# Patient Record
Sex: Female | Born: 1990 | Race: Black or African American | Hispanic: No | Marital: Single | Smoking: Never smoker
Health system: Southern US, Community
[De-identification: ages and names within clinical notes are randomized; demographics above are authoritative.]

---
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
## 2019-03-24 ENCOUNTER — Other Ambulatory Visit: Payer: Self-pay

## 2019-03-24 ENCOUNTER — Encounter (HOSPITAL_COMMUNITY): Payer: Self-pay | Admitting: Emergency Medicine

## 2019-03-24 ENCOUNTER — Ambulatory Visit (HOSPITAL_COMMUNITY)
Admission: EM | Admit: 2019-03-24 | Discharge: 2019-03-24 | Disposition: A | Discharge status: Home / Self Care | Payer: 59 | Attending: Emergency Medicine | Admitting: Emergency Medicine

## 2019-03-24 ENCOUNTER — Ambulatory Visit (INDEPENDENT_AMBULATORY_CARE_PROVIDER_SITE_OTHER): Payer: 59

## 2019-03-24 DIAGNOSIS — S63502A Unspecified sprain of left wrist, initial encounter: Secondary | ICD-10-CM

## 2019-03-24 MED ORDER — ACETAMINOPHEN 325 MG PO TABS
ORAL_TABLET | ORAL | Status: AC
  Filled 2019-03-24: qty 2

## 2019-03-24 MED ORDER — ACETAMINOPHEN 325 MG PO TABS
650.0000 mg | ORAL_TABLET | Freq: Once | ORAL | Status: AC
  Administered 2019-03-24: 650 mg via ORAL
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
## 2019-03-24 NOTE — Discharge Instructions (Signed)
  You may take 500mg acetaminophen every 4-6 hours or in combination with ibuprofen 400-600mg every 6-8 hours as needed for pain and inflammation.  
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
## 2021-02-14 ENCOUNTER — Encounter (HOSPITAL_COMMUNITY): Payer: Self-pay

## 2021-02-14 ENCOUNTER — Emergency Department (HOSPITAL_COMMUNITY)
Admission: EM | Admit: 2021-02-14 | Discharge: 2021-02-15 | Disposition: A | Discharge status: Home / Self Care | Payer: Self-pay | Attending: Emergency Medicine | Admitting: Emergency Medicine

## 2021-02-14 ENCOUNTER — Other Ambulatory Visit: Payer: Self-pay

## 2021-02-14 DIAGNOSIS — W540XXA Bitten by dog, initial encounter: Secondary | ICD-10-CM | POA: Insufficient documentation

## 2021-02-14 DIAGNOSIS — L089 Local infection of the skin and subcutaneous tissue, unspecified: Secondary | ICD-10-CM | POA: Insufficient documentation

## 2021-02-14 DIAGNOSIS — S61251A Open bite of left index finger without damage to nail, initial encounter: Secondary | ICD-10-CM | POA: Insufficient documentation

## 2021-02-14 DIAGNOSIS — Z23 Encounter for immunization: Secondary | ICD-10-CM | POA: Insufficient documentation

## 2021-02-14 NOTE — ED Triage Notes (Signed)
Pt states she was bit on left index finger 5 days ago. Dog is vaccinated. Pt states she noticed pus draining from wound today. Pt does not know when her last tetanus was.

## 2021-02-15 ENCOUNTER — Emergency Department (HOSPITAL_COMMUNITY): Payer: Self-pay

## 2021-02-15 MED ORDER — DOXYCYCLINE HYCLATE 100 MG PO TABS
100.0000 mg | ORAL_TABLET | Freq: Once | ORAL | Status: AC
  Administered 2021-02-15: 100 mg via ORAL
  Filled 2021-02-15: qty 1

## 2021-02-15 MED ORDER — TETANUS-DIPHTH-ACELL PERTUSSIS 5-2.5-18.5 LF-MCG/0.5 IM SUSY
0.5000 mL | PREFILLED_SYRINGE | Freq: Once | INTRAMUSCULAR | Status: AC
  Administered 2021-02-15: 0.5 mL via INTRAMUSCULAR
  Filled 2021-02-15: qty 0.5

## 2021-02-15 MED ORDER — METRONIDAZOLE 500 MG PO TABS: 500.0000 mg | ORAL_TABLET | Freq: Three times a day (TID) | ORAL | 0 refills | Status: AC

## 2021-02-15 MED ORDER — METRONIDAZOLE 500 MG PO TABS
500.0000 mg | ORAL_TABLET | Freq: Once | ORAL | Status: AC
  Administered 2021-02-15: 500 mg via ORAL
  Filled 2021-02-15: qty 1

## 2021-02-15 MED ORDER — DOXYCYCLINE HYCLATE 100 MG PO CAPS: 100.0000 mg | ORAL_CAPSULE | Freq: Two times a day (BID) | ORAL | 0 refills | Status: AC

## 2021-02-15 NOTE — Discharge Instructions (Addendum)
You were evaluated in the Emergency Department and after careful evaluation, we did not find any emergent condition requiring admission or further testing in the hospital.  Your exam/testing today was overall reassuring.  X-ray was normal.  Please take the Flagyl and doxycycline antibiotics as directed.  Use Tylenol or Motrin for pain.  Recommend recheck of this wound in 2 or 3 days.  Please return to the Emergency Department if you experience any worsening of your condition.  Thank you for allowing [place] to be a part of your care.

## 2021-02-15 NOTE — ED Notes (Signed)
Patient transported to X-ray 

## 2021-02-15 NOTE — ED Provider Notes (Signed)
[facility] Emergency Department Provider Note MRN:  [id number]  Arrival date & time: 02/15/21     Chief Complaint   Animal Bite   History of Present Illness   Deborah Cooper is a 30 y.o. year-old female with no pertinent past medical history presenting to the ED with chief complaint of animal bite.  Friend's dog bit her left pointer finger 5 days ago, her mother has been tending to the wound since then and it has been looking overall fine until today when it looked more red, more painful, one of the puncture wounds draining purulent material.  Denies fever, no other injuries or complaints pain is moderate, much worse with any motion or palpation.  Review of Systems  A complete 10 system review of systems was obtained and all systems are negative except as noted in the HPI and PMH.   Patient's Health History   History reviewed. No pertinent past medical history.  History reviewed. No pertinent surgical history.  History reviewed. No pertinent family history.  Social History   Socioeconomic History  . Marital status: Single    Spouse name: Not on file  . Number of children: Not on file  . Years of education: Not on file  . Highest education level: Not on file  Occupational History  . Not on file  Tobacco Use  . Smoking status: Never Smoker  . Smokeless tobacco: Never Used  Substance and Sexual Activity  . Alcohol use: Never  . Drug use: Never  . Sexual activity: Never  Other Topics Concern  . Not on file  Social History Narrative  . Not on file   Social Determinants of Health   Financial Resource Strain: Not on file  Food Insecurity: Not on file  Transportation Needs: Not on file  Physical Activity: Not on file  Stress: Not on file  Social Connections: Not on file  Intimate Partner Violence: Not on file     Physical Exam   Vitals:   02/15/21 0056 02/15/21 0216  BP:  (!) 126/95  Pulse:  72  Resp:  17  Temp:    SpO2: 99% 100%     CONSTITUTIONAL: Well-appearing, NAD NEURO:  Alert and oriented x 3, no focal deficits EYES:  eyes equal and reactive ENT/NECK:  no LAD, no JVD CARDIO: Regular rate, well-perfused, normal S1 and S2 PULM:  CTAB no wheezing or rhonchi GI/GU:  normal bowel sounds, non-distended, non-tender MSK/SPINE:  No gross deformities, no edema SKIN: Erythema and tenderness to palpation to the left distal pointer finger with multiple puncture wounds present, one of them oozing a small amount of purulent material PSYCH:  Appropriate speech and behavior  *Additional and/or pertinent findings included in MDM below  Diagnostic and Interventional Summary    EKG Interpretation  Date/Time:    Ventricular Rate:    PR Interval:    QRS Duration:   QT Interval:    QTC Calculation:   R Axis:     Text Interpretation:        Labs Reviewed - No data to display  DG Hand 2 View Left  Final Result      Medications  doxycycline (VIBRA-TABS) tablet 100 mg (100 mg Oral Given 02/15/21 0145)  metroNIDAZOLE (FLAGYL) tablet 500 mg (500 mg Oral Given 02/15/21 0145)  Tdap (BOOSTRIX) injection 0.5 mL (0.5 mLs Intramuscular Given 02/15/21 0146)     Procedures  /  Critical Care Procedures  ED Course and Medical Decision Making  I  have reviewed the triage vital signs, the nursing notes, and pertinent available records from the EMR.  Listed above are laboratory and imaging tests that I personally ordered, reviewed, and interpreted and then considered in my medical decision making (see below for details).  Infection due to dog bite, there is no fluctuance or any sign of need for I&D at this time, actively draining.  Dog is known to the patient, dog is vaccinated for rabies.  Will x-ray to exclude foreign body or underlying fracture.  Will update tetanus, started on antibiotics.  Patient is pen allergic, will provide doxycycline and Flagyl.  Patient states there is no chance she is pregnant and declines pregnancy testing  at this time.  Advised recheck of this wound in 2 or 3 days, if not at urgent care or PCP here in the emergency department.       Elmer Sow. Pilar Plate, MD [facility] Emergency Medicine [facility] [email or web address]  Final Clinical Impressions(s) / ED Diagnoses     ICD-10-CM   1. Dog bite, initial encounter  W54.0XXA   2. Finger infection  L08.9     ED Discharge Orders         Ordered    doxycycline (VIBRAMYCIN) 100 MG capsule  2 times daily        02/15/21 0218    metroNIDAZOLE (FLAGYL) 500 MG tablet  3 times daily        02/15/21 0218           Discharge Instructions Discussed with and Provided to Patient:     Discharge Instructions     You were evaluated in the Emergency Department and after careful evaluation, we did not find any emergent condition requiring admission or further testing in the hospital.  Your exam/testing today was overall reassuring.  X-ray was normal.  Please take the Flagyl and doxycycline antibiotics as directed.  Use Tylenol or Motrin for pain.  Recommend recheck of this wound in 2 or 3 days.  Please return to the Emergency Department if you experience any worsening of your condition.  Thank you for allowing [place] to be a part of your care.        Sabas Sous, MD 02/15/21 [phone]

## 2021-02-15 NOTE — ED Notes (Signed)
Pt reports does to want to call animal control, pts dog belongs to a cousin, NADN.

## 2022-10-03 IMAGING — CR DG HAND 2V*L*
2 series · 2 of 2 positions shown · non-contrast
Comparison: Left wrist radiograph 04/23/2019

CLINICAL DATA: Concern for foreign body at right index finger, dog
bite

EXAM:
LEFT HAND - 2 VIEW

[hand pa]
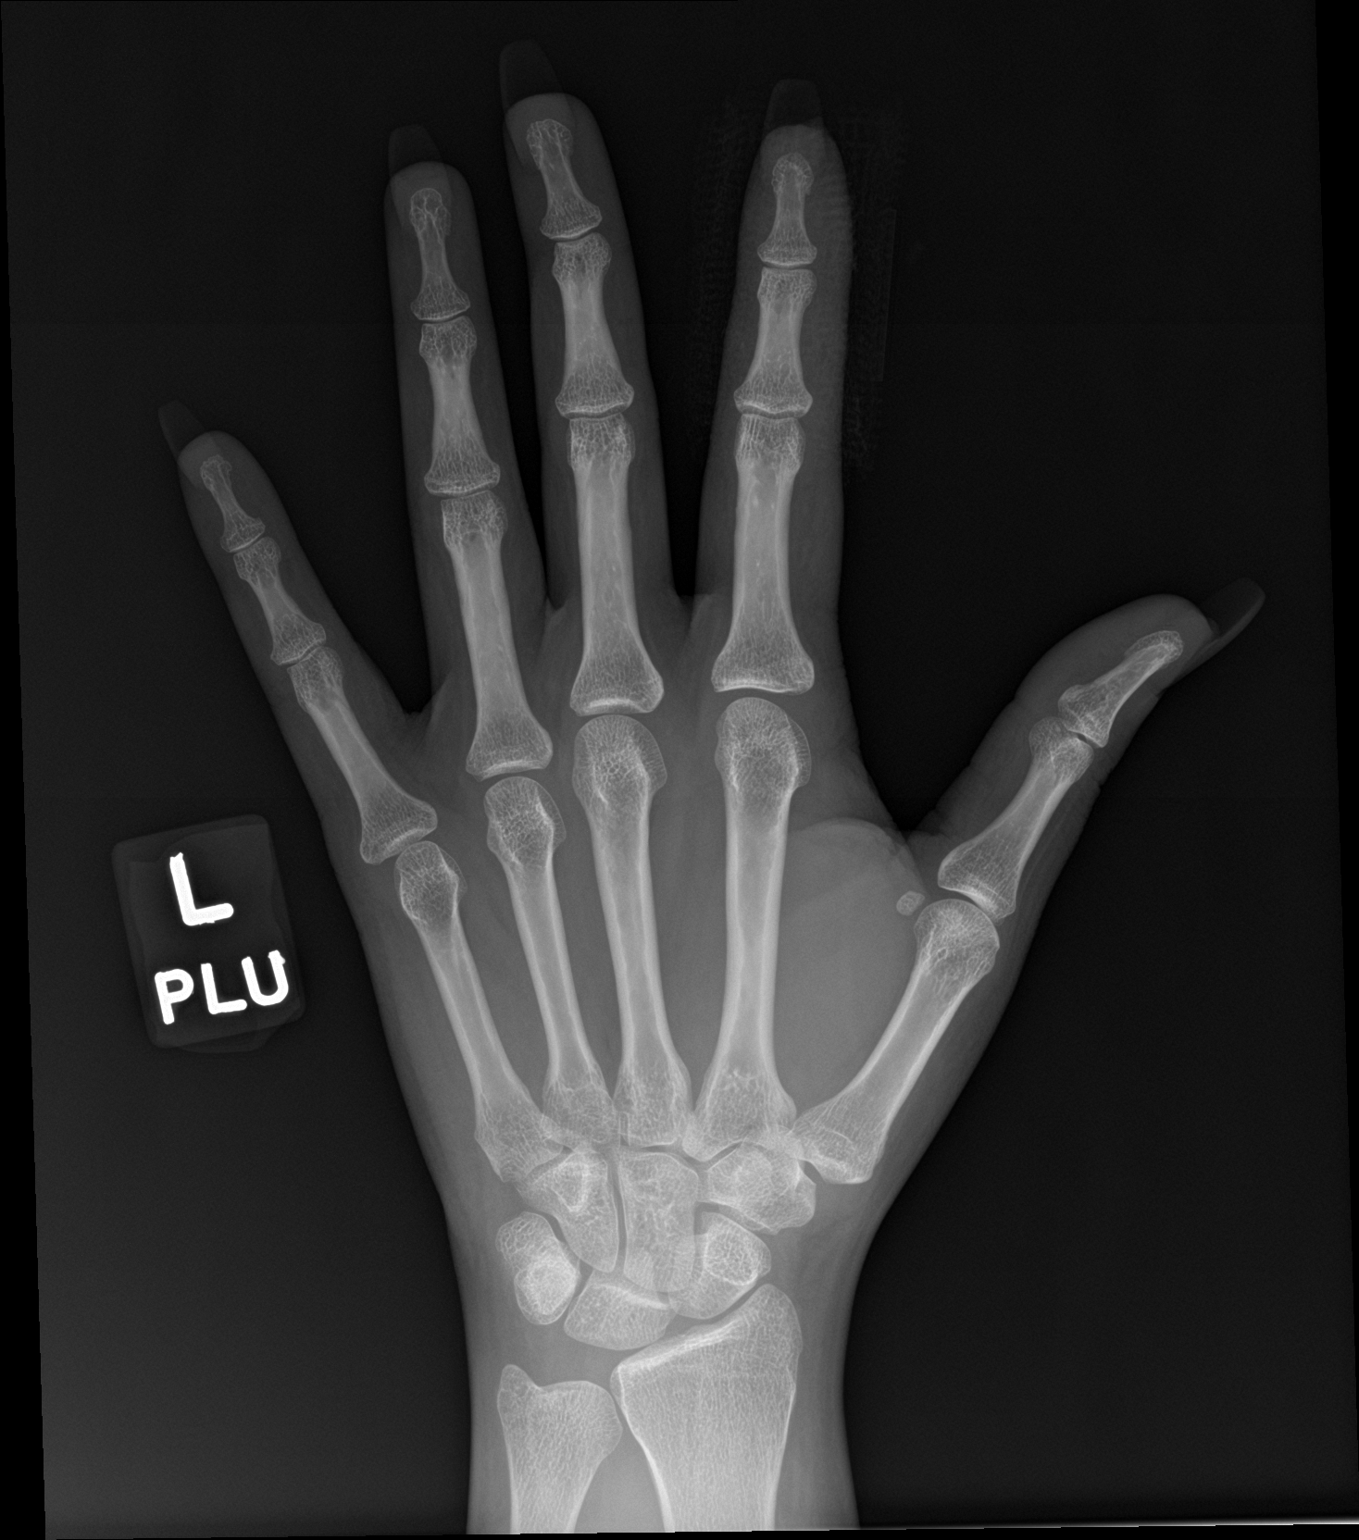

[hand lat]
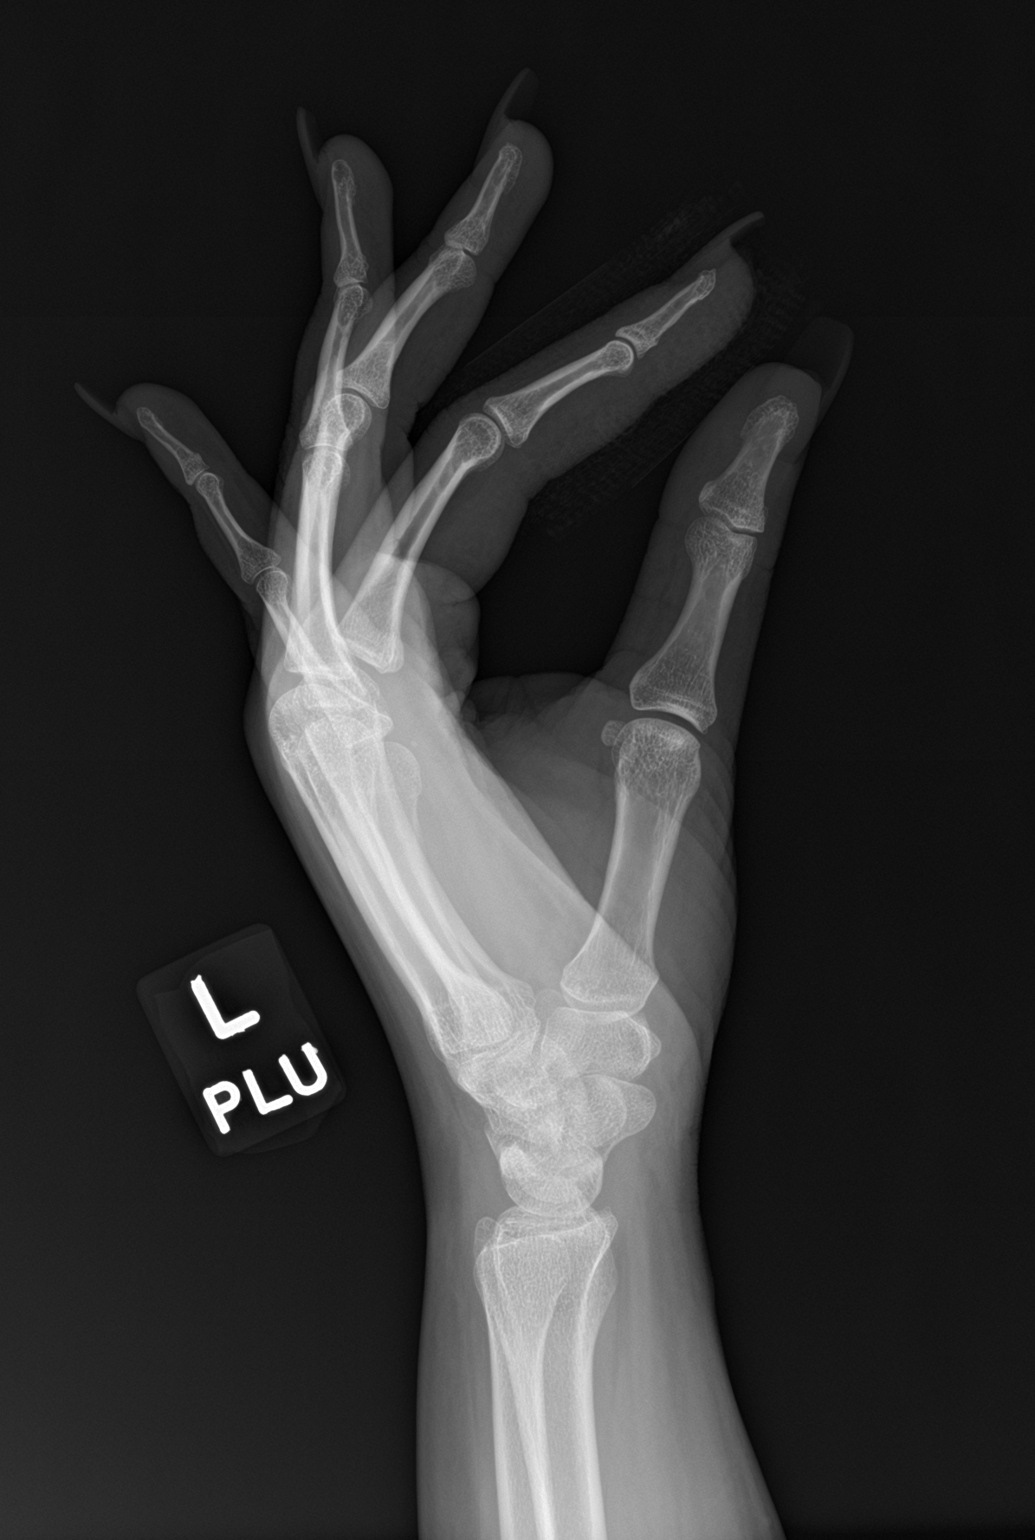

[2 of 2 positions shown; findings below may reference images not displayed]

FINDINGS: No visible radiopaque foreign body. Bandaging material noted about
the second digit. No acute bony abnormality. Specifically, no
fracture, subluxation, or dislocation.
IMPRESSION: Bandaging material about the second digit with some minimal soft
tissue irregularity, correlate for laceration.

No radiopaque foreign body.

No acute osseous abnormality.

## 2023-11-23 ENCOUNTER — Emergency Department (HOSPITAL_COMMUNITY): Payer: 59

## 2023-11-23 ENCOUNTER — Encounter (HOSPITAL_COMMUNITY): Payer: Self-pay

## 2023-11-23 ENCOUNTER — Other Ambulatory Visit: Payer: Self-pay

## 2023-11-23 ENCOUNTER — Observation Stay (HOSPITAL_COMMUNITY): Payer: 59

## 2023-11-23 ENCOUNTER — Inpatient Hospital Stay (HOSPITAL_COMMUNITY)
Admission: EM | Admit: 2023-11-23 | Discharge: 2023-11-27 | DRG: 098 | Disposition: A | Discharge status: Home / Self Care | Payer: 59 | Attending: Internal Medicine | Admitting: Internal Medicine

## 2023-11-23 DIAGNOSIS — Z823 Family history of stroke: Secondary | ICD-10-CM

## 2023-11-23 DIAGNOSIS — R5383 Other fatigue: Secondary | ICD-10-CM | POA: Diagnosis present

## 2023-11-23 DIAGNOSIS — G9340 Encephalopathy, unspecified: Secondary | ICD-10-CM | POA: Diagnosis present

## 2023-11-23 DIAGNOSIS — R0902 Hypoxemia: Secondary | ICD-10-CM | POA: Diagnosis not present

## 2023-11-23 DIAGNOSIS — R569 Unspecified convulsions: Secondary | ICD-10-CM | POA: Diagnosis not present

## 2023-11-23 DIAGNOSIS — M47812 Spondylosis without myelopathy or radiculopathy, cervical region: Secondary | ICD-10-CM | POA: Diagnosis present

## 2023-11-23 DIAGNOSIS — G9341 Metabolic encephalopathy: Secondary | ICD-10-CM | POA: Diagnosis present

## 2023-11-23 DIAGNOSIS — R000 Tachycardia, unspecified: Secondary | ICD-10-CM | POA: Diagnosis not present

## 2023-11-23 DIAGNOSIS — M50222 Other cervical disc displacement at C5-C6 level: Secondary | ICD-10-CM | POA: Diagnosis present

## 2023-11-23 DIAGNOSIS — Z880 Allergy status to penicillin: Secondary | ICD-10-CM

## 2023-11-23 DIAGNOSIS — R059 Cough, unspecified: Secondary | ICD-10-CM | POA: Diagnosis present

## 2023-11-23 DIAGNOSIS — R4182 Altered mental status, unspecified: Principal | ICD-10-CM

## 2023-11-23 DIAGNOSIS — M545 Low back pain, unspecified: Secondary | ICD-10-CM | POA: Diagnosis present

## 2023-11-23 DIAGNOSIS — G030 Nonpyogenic meningitis: Secondary | ICD-10-CM | POA: Diagnosis not present

## 2023-11-23 DIAGNOSIS — G9349 Other encephalopathy: Secondary | ICD-10-CM | POA: Diagnosis present

## 2023-11-23 DIAGNOSIS — E66813 Obesity, class 3: Secondary | ICD-10-CM | POA: Diagnosis present

## 2023-11-23 DIAGNOSIS — Z1152 Encounter for screening for COVID-19: Secondary | ICD-10-CM

## 2023-11-23 DIAGNOSIS — E876 Hypokalemia: Secondary | ICD-10-CM | POA: Diagnosis not present

## 2023-11-23 LAB — I-STAT VENOUS BLOOD GAS, ED
Acid-Base Excess: 2 mmol/L (ref 0.0–2.0)
Bicarbonate: 25.1 mmol/L (ref 20.0–28.0)
Calcium, Ion: 1.13 mmol/L — ABNORMAL LOW (ref 1.15–1.40)
HCT: 39 % (ref 36.0–46.0)
Hemoglobin: 13.3 g/dL (ref 12.0–15.0)
O2 Saturation: 88 %
Potassium: 3.7 mmol/L (ref 3.5–5.1)
Sodium: 141 mmol/L (ref 135–145)
TCO2: 26 mmol/L (ref 22–32)
pCO2, Ven: 34.4 mm[Hg] — ABNORMAL LOW (ref 44–60)
pH, Ven: 7.471 — ABNORMAL HIGH (ref 7.25–7.43)
pO2, Ven: 51 mm[Hg] — ABNORMAL HIGH (ref 32–45)

## 2023-11-23 LAB — RESP PANEL BY RT-PCR (RSV, FLU A&B, COVID)  RVPGX2
Influenza A by PCR: NEGATIVE
Influenza B by PCR: NEGATIVE
Resp Syncytial Virus by PCR: NEGATIVE
SARS Coronavirus 2 by RT PCR: NEGATIVE

## 2023-11-23 LAB — COMPREHENSIVE METABOLIC PANEL
ALT: 52 U/L — ABNORMAL HIGH (ref 0–44)
AST: 37 U/L (ref 15–41)
Albumin: 3.6 g/dL (ref 3.5–5.0)
Alkaline Phosphatase: 57 U/L (ref 38–126)
Anion gap: 10 (ref 5–15)
BUN: 8 mg/dL (ref 6–20)
CO2: 23 mmol/L (ref 22–32)
Calcium: 8.9 mg/dL (ref 8.9–10.3)
Chloride: 105 mmol/L (ref 98–111)
Creatinine, Ser: 0.7 mg/dL (ref 0.44–1.00)
GFR, Estimated: >60 mL/min (ref >60)
Glucose, Bld: 117 mg/dL — ABNORMAL HIGH (ref 70–99)
Potassium: 3.6 mmol/L (ref 3.5–5.1)
Sodium: 138 mmol/L (ref 135–145)
Total Bilirubin: 0.4 mg/dL (ref 0.0–1.2)
Total Protein: 7.3 g/dL (ref 6.5–8.1)

## 2023-11-23 LAB — RAPID URINE DRUG SCREEN, HOSP PERFORMED
Amphetamines: NOT DETECTED
Barbiturates: NOT DETECTED
Benzodiazepines: NOT DETECTED
Cocaine: NOT DETECTED
Opiates: NOT DETECTED
Tetrahydrocannabinol: NOT DETECTED

## 2023-11-23 LAB — C-REACTIVE PROTEIN: CRP: 0.6 mg/dL (ref <1.0)

## 2023-11-23 LAB — CBC
HCT: 40.6 % (ref 36.0–46.0)
Hemoglobin: 12.5 g/dL (ref 12.0–15.0)
MCH: 25.5 pg — ABNORMAL LOW (ref 26.0–34.0)
MCHC: 30.8 g/dL (ref 30.0–36.0)
MCV: 82.9 fL (ref 80.0–100.0)
Platelets: 277 10*3/uL (ref 150–400)
RBC: 4.9 MIL/uL (ref 3.87–5.11)
RDW: 14.5 % (ref 11.5–15.5)
WBC: 7.3 10*3/uL (ref 4.0–10.5)
nRBC: 0 % (ref 0.0–0.2)

## 2023-11-23 LAB — ACETAMINOPHEN LEVEL: Acetaminophen (Tylenol), Serum: <10 ug/mL — ABNORMAL LOW (ref 10–30)

## 2023-11-23 LAB — PREGNANCY, URINE: Preg Test, Ur: NEGATIVE

## 2023-11-23 LAB — TSH: TSH: 1.767 u[IU]/mL (ref 0.350–4.500)

## 2023-11-23 LAB — SALICYLATE LEVEL: Salicylate Lvl: <7 mg/dL — ABNORMAL LOW (ref 7.0–30.0)

## 2023-11-23 LAB — AMMONIA: Ammonia: 27 umol/L (ref 9–35)

## 2023-11-23 LAB — ETHANOL: Alcohol, Ethyl (B): <10 mg/dL (ref <10)

## 2023-11-23 LAB — VITAMIN B12: Vitamin B-12: 581 pg/mL (ref 180–914)

## 2023-11-23 MED ORDER — LORAZEPAM 2 MG/ML IJ SOLN
2.0000 mg | Freq: Four times a day (QID) | INTRAMUSCULAR | Status: DC | PRN
  Administered 2023-11-24: 2 mg via INTRAVENOUS
  Filled 2023-11-23 (×2): qty 1

## 2023-11-23 MED ORDER — LORAZEPAM 2 MG/ML IJ SOLN
1.0000 mg | Freq: Once | INTRAMUSCULAR | Status: AC | PRN
  Administered 2023-11-23: 1 mg via INTRAVENOUS
  Filled 2023-11-23: qty 1

## 2023-11-23 MED ORDER — ACETAMINOPHEN 650 MG RE SUPP: 650.0000 mg | Freq: Four times a day (QID) | RECTAL | Status: DC | PRN

## 2023-11-23 MED ORDER — POLYETHYLENE GLYCOL 3350 17 G PO PACK: 17.0000 g | PACK | Freq: Every day | ORAL | Status: DC | PRN

## 2023-11-23 MED ORDER — ACETAMINOPHEN 325 MG PO TABS: 650.0000 mg | ORAL_TABLET | Freq: Four times a day (QID) | ORAL | Status: DC | PRN

## 2023-11-23 MED ORDER — SODIUM CHLORIDE 0.9% FLUSH
3.0000 mL | Freq: Two times a day (BID) | INTRAVENOUS | Status: DC
  Administered 2023-11-23 – 2023-11-26 (×5): 3 mL via INTRAVENOUS

## 2023-11-23 MED ORDER — SODIUM CHLORIDE 0.9 % IV BOLUS
1000.0000 mL | Freq: Once | INTRAVENOUS | Status: AC
  Administered 2023-11-23: 1000 mL via INTRAVENOUS

## 2023-11-23 MED ORDER — ONDANSETRON HCL 4 MG/2ML IJ SOLN
4.0000 mg | Freq: Once | INTRAMUSCULAR | Status: AC
  Administered 2023-11-23: 4 mg via INTRAVENOUS
  Filled 2023-11-23: qty 2

## 2023-11-23 NOTE — ED Notes (Signed)
 Patient transported to CT

## 2023-11-23 NOTE — ED Notes (Signed)
Unable to obtain Urine at this time.
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
## 2023-11-23 NOTE — Consult Note (Addendum)
 NEUROLOGY CONSULT NOTE   Date of service: November 23, 2023 Patient Name: Deborah Cooper MRN:  [id number] DOB:  May 20, 1991 Chief Complaint: AMS Requesting Provider: Neysa Caron PARAS, DO  History of Present Illness  Deborah Cooper is a 33 y.o. female with no past medical history who presents with altered mental status.  Patient is unable to provide any significant history and most of this information was provided by mother and aunt at bedside.  They report that the patient has been sick for over a week and has been experiencing symptoms including cough, fatigue, intermittent hot flashes with diaphoresis and chills, as well as bodyaches.  Mother does report the patient reported that her lower spine was hurting and that it was improved when she was sitting in a recliner.  Patient reported that her arms are weak and that her legs felt heavy.  Additionally last night mother reported the patient had poor appetite and went to bed early.  This morning patient was found in her bed with her morning alarm going off.  Patient was arousable and opened her eyes but mother reports that her daughter's eyes were glossy.  She reports that the patient was speaking unintelligibly.  EMS was called.  While with EMS she was given oxygen and had marked improvement in her mental status to where she could speak normally again but then was noted to have disorientation.  Patient has not had any significant mental status changes since that time.    Mother denies the patient has waxed or waned with mental status.  Mother denies any seizure-like activity currently or in the past.    ROS  Provided by patient who is altered but able to respond.  Review of Systems  Constitutional:  Positive for chills and diaphoresis. Negative for fever.  HENT:  Negative for congestion, ear pain and hearing loss.   Respiratory:  Positive for cough. Negative for shortness of breath.   Cardiovascular:  Negative for chest pain and palpitations.   Gastrointestinal:  Negative for constipation, diarrhea, nausea and vomiting.  Genitourinary:  Negative for dysuria, hematuria and urgency.  Musculoskeletal:  Positive for myalgias.  Skin:  Negative for rash.  Neurological:  Positive for weakness. Negative for dizziness, tingling, tremors, speech change, focal weakness, loss of consciousness and headaches.  Psychiatric/Behavioral:  Negative for hallucinations and substance abuse. The patient does not have insomnia.      Past History  Medical diagnoses: None Medications: None Surgeries: None Hospitalizations: None Allergies: Penicillin LMP: Unable to identify due to AM  Family History: Stroke in second relative.  No family history of epilepsy.  No other pertinent family history.  Social History Reports that she lives at home with her family.  Reports that she is currently employed.  Reports that she does not use any substances including alcohol, nicotine, cannabis.  Allergies  Allergen Reactions   Penicillins     Medications  No current facility-administered medications for this encounter. No current outpatient medications on file.  Vitals   Vitals:   11/23/23 0855 11/23/23 1049 11/23/23 1342 11/23/23 1441  BP: (!) 125/91 (!) 129/94  134/86  Pulse: (!) 116 98  96  Resp: 18 18  17   Temp: 97.8 F (36.6 C)  98.9 F (37.2 C) 98.1 F (36.7 C)  TempSrc: Oral  Oral   SpO2: 98% 100%  90%    There is no height or weight on file to calculate BMI.  Physical Exam   Constitutional: Appears well-developed and well-nourished.  Laying in bed and not alert but is easily arousable to verbal stimuli. Psych: Affect appropriate to situation. No catatonic features.  Eyes: No scleral injection.  HENT: Tongue has bite marks. No OP obstruction.  No obvious signs of current upper respiratory infection including runny nose, congestion. Head: Normocephalic.  Neck: No meningismus with neck flexion. No neck stiffness with passive ROM.   Cardiovascular: Normal rate and regular rhythm.  No murmurs rubs or gallops. Respiratory: Effort normal, non-labored breathing.  Lungs clear to auscultation bilaterally. GI: Obese.  Soft.  No distension. There is no tenderness.  Bowel sounds present. Skin: WDI.  No obvious rashes.  Neurologic Examination   Mental status exam: Patient is disoriented; she is able to provide her first name but not her middle or her last name, knows she is in [place]  but cannot identify the hospital. Knows last holiday was Christmas but does not know the month, year or season, and is unable to appreciate the current situation.  She has poor attention as evident by conversation and exam with patient.  Memory was difficult to assess due to impaired attention but short-term memory does not appear intact. Executive function is poor and patient is not able to describe abstract thoughts such as the meaning of one of her tattoos.  Speech: Speech fluent without dysarthria. patient able to identify common objects.  Cranial Nerves: II:  Visual fields grossly normal, pupils equal, round, reactive to light and accommodation III,IV, VI: ptosis not present, extra-ocular motions intact bilaterally V,VII: smile symmetric, facial light touch sensation normal bilaterally VIII: hearing normal bilaterally IX,X: uvula rises symmetrically XI: bilateral shoulder shrug XII: midline tongue extension without atrophy or fasciculations Motor: Right : Upper extremity   5/5    Left:     Upper extremity   5/5  Lower extremity   5/5     Lower extremity   5/5 Tone and bulk:normal tone throughout; no atrophy noted Sensory: Light touch intact throughout, bilaterally Deep Tendon Reflexes:  Brachioradialis: 2+ bilaterally  Patellar: left 1+, right 2+ Achilles 2+ bilaterally.  Cerebellar: Normal finger-to-nose bilaterally  Gait: Deferred formal assessment but patient did get out of EEG facility bed and back into her hospital  bed without issues  Labs/Imaging/Neurodiagnostic studies   CBC:  Recent Labs  Lab Dec 18, 2023 1006 12/18/23 1047  WBC 7.3  --   HGB 12.5 13.3  HCT 40.6 39.0  MCV 82.9  --   PLT 277  --    Basic Metabolic Panel:  Lab Results  Component Value Date   NA 141 12-18-23   K 3.7 18-Dec-2023   CO2 23 12/18/2023   GLUCOSE 117 (H) 12-18-2023   BUN 8 December 18, 2023   CREATININE 0.70 12/18/2023   CALCIUM 8.9 12/18/23   GFRNONAA >60 12/18/2023  Liver function unremarkable.   Lipid Panel: No results found for: LDLCALC HgbA1c: No results found for: HGBA1C Urine Drug Screen:     Component Value Date/Time   LABOPIA NONE DETECTED 12/18/23 1441   COCAINSCRNUR NONE DETECTED 2023-12-18 1441   LABBENZ NONE DETECTED 2023-12-18 1441   AMPHETMU NONE DETECTED 2023-12-18 1441   THCU NONE DETECTED Dec 18, 2023 1441   LABBARB NONE DETECTED 12-18-2023 1441    Alcohol Level     Component Value Date/Time   ETH <10 12-18-2023 1006   INR No results found for: INR APTT No results found for: APTT AED levels: No results found for: PHENYTOIN, ZONISAMIDE, LAMOTRIGINE, LEVETIRACETA  EKG: NSR, no abnormalities, Qtc 435  Pregnancy: negative  Respiratory panel: negative Acetaminophen : negative Salicylate level: negative  CT Head without contrast(Personally reviewed): No acute intracranial process  MRI Brain: Pending   Neurodiagnostics rEEG: Continuous slow, generalized   ASSESSMENT  Sury CONSANDRA LASKE is a 33 y.o. female with no past medical history who presents with disorientation and impaired attention in the setting of recent upper respiratory infection.   - Patient has some disorientation on neurologic exam, but question if this is due to embellishment or due to an underlying neurological etiology. Suspect the latter due to the continuous slowing on EEG. DDx for event at home includes subclinical seizure followed by postictal state, or toxidrome. No asterixis on exam, but does  appear somewhat drowsy. No nuchal rigidity and she does not appear toxic.  - ALT elevated, but AST and T. Bili are normal. Ca and Na are normal. Glucose mildly elevated at 117. BUN and Cr are normal. WBC normal. UDS negative.   - CT head is normal.   - Vitals have been stable and labs have been unremarkable decreasing likelihood for electrolyte disturbances, infectious causes, endocrine disorders, and toxicities. EEG was not consistent with seizure and thus is suggestive of nonepileptic etiology. Patient did have some risk factors for CNS infection including preceding respiratory infection and spinal pain prior to admission but currently afebrile, normal neuro exam, and no meningismus on exam, so will defer LP in favor of continued observation from a risk/benefit standpoint. If she does not improve, and if LTM does not show any seizures, will most likely need to obtain an LP to assess for possible autoimmune encephalopathy.  - Overall impression: Encephalopathy of unknown etiology, suspect possible new-onset subclinical seizure activity versus autoimmune cause (DDx would include Hashimoto's thyroiditis, CNS lupus, neurosarcoidosis and anti-NMDA receptor encephalitis).    RECOMMENDATIONS  - MRI brain with and without contrast - ESR and CRP (ordered) - Lupus panel (ordered) - TSH, B12, RPR and ammonia (ordered) - Serum anti-NMDA antibody level (ordered as a send-out) - Supportive care including neuro checks - Will obtain LTM EEG to capture a possible spell.   ______________________________________________________________________    Justino Cornish, MD PGY-1 Psychiatry Resident 11/23/2023, 4:23 PM  I have seen and examined the patient. I have formulated the assessment and recommendations. 33 year old female presenting with encephalopathy of unknown etiology. Exam reveals a disoriented and mildly drowsy patient. EEG with diffuse slowing.  Electronically signed: Dr. Nakeyia Menden

## 2023-11-23 NOTE — ED Notes (Addendum)
 Patient transported to MRI

## 2023-11-23 NOTE — ED Notes (Signed)
 Transported to EEG

## 2023-11-23 NOTE — ED Notes (Signed)
 Patient transported to X-ray

## 2023-11-23 NOTE — ED Triage Notes (Signed)
 Pt BIB [facility] from home d/t family reporting AMS this morning when she was woke by her alarm & was speaking incomprehensible words not making sense. EMS reports she has no medical Hx & does not take meds. Endorses she was only alert to self on scene & did she that she bit her tongue, no Hx of seizures. Family reports she had been sick the last few days & her arm hurt. Does have 20g Lt hand PIV, 95% RA & 100% on 2L n/c, CBG 141, 120 bpm, 140/60.

## 2023-11-23 NOTE — H&P (Signed)
 History and Physical   Deborah Cooper FMW:[id number] DOB: 1991/07/05 DOA: 11/23/2023  PCP: Patient, No Pcp Per   Patient coming from: Home  Chief Complaint: Acute encephalopathy  HPI: Deborah Cooper is a 33 y.o. female with no known significant past medical history presenting with encephalopathy.  Family reports finding patient altered this morning.  She was noted to be feeling unwell yesterday with some cough, body aches, fatigue and decreased appetite.  Reportedly her legs seemed heavy and had some trouble holding up a mirror yesterday.  Family went to check on her this morning because her alarms Going off.  She looked at them with a blanks there and had incomprehensible speech.  Some improvement on the way to the ED but is still not at baseline.  No history of seizures no nausea no vomiting no diarrhea no fevers.  Family also reports some complaint of low back pain in addition to the leg heaviness yesterday.  No reports of fevers, chills, chest pain, shortness of breath, abdominal pain, constipation, diarrhea, nausea, vomiting.  ED Course: Vital signs in the ED notable for heart rate in the 90s to 110s, blood pressure in the 120s to 130s systolic.  Lab workup included CMP with glucose 117, ALT 52.  CBC within normal limits.  Rester panel for flu COVID RSV negative.  Tylenol , aspirin, ethanol negative.  UDS normal.  VBG with pH 7.47 and pCO2 34.  Chest x-ray showed no acute Sharol.  CT head showed no acute abnormality.  MR brain pending.  Patient received 1 L of IV fluids in the ED as well as a dose of Zofran .  Neurology consulted recommending EEG and MRI and will follow.  Review of Systems: As per HPI otherwise all other systems reviewed and are negative.  History reviewed. No pertinent past medical history.  History reviewed. No pertinent surgical history.  Social History  reports that she has never smoked. She has never used smokeless tobacco. She reports that she does not drink  alcohol and does not use drugs.  Allergies  Allergen Reactions   Penicillins     History reviewed. No pertinent family history.  Prior to Admission medications   Not on File   Physical Exam: Vitals:   11/23/23 0855 11/23/23 1049 11/23/23 1342 11/23/23 1441  BP: (!) 125/91 (!) 129/94  134/86  Pulse: (!) 116 98  96  Resp: 18 18  17   Temp: 97.8 F (36.6 C)  98.9 F (37.2 C) 98.1 F (36.7 C)  TempSrc: Oral  Oral   SpO2: 98% 100%  90%    Physical Exam Constitutional:      General: She is not in acute distress.    Appearance: Normal appearance.  HENT:     Head: Normocephalic and atraumatic.     Mouth/Throat:     Mouth: Mucous membranes are moist.     Pharynx: Oropharynx is clear.  Eyes:     Extraocular Movements: Extraocular movements intact.     Pupils: Pupils are equal, round, and reactive to light.  Cardiovascular:     Rate and Rhythm: Normal rate and regular rhythm.     Pulses: Normal pulses.     Heart sounds: Normal heart sounds.  Pulmonary:     Effort: Pulmonary effort is normal. No respiratory distress.     Breath sounds: Normal breath sounds.  Abdominal:     General: Bowel sounds are normal. There is no distension.     Palpations: Abdomen is soft.  Tenderness: There is no abdominal tenderness.  Musculoskeletal:        General: No swelling or deformity.  Skin:    General: Skin is warm and dry.  Neurological:     Comments: Patient is awake, alert, oriented x2 (not to year) Motor: Reduced but adequate effort thorughout, at Least 5/5 bilateral UE, 5/5 bilateral lower extremitiy  Sensory: Sensation is grossly intact bilateral UEs & LEs    Labs on Admission: I have personally reviewed following labs and imaging studies  CBC: Recent Labs  Lab 11/23/23 1006 11/23/23 1047  WBC 7.3  --   HGB 12.5 13.3  HCT 40.6 39.0  MCV 82.9  --   PLT 277  --     Basic Metabolic Panel: Recent Labs  Lab 11/23/23 1006 11/23/23 1047  NA 138 141  K 3.6 3.7  CL  105  --   CO2 23  --   GLUCOSE 117*  --   BUN 8  --   CREATININE 0.70  --   CALCIUM 8.9  --     GFR: CrCl cannot be calculated (Unknown ideal weight.).  Liver Function Tests: Recent Labs  Lab 11/23/23 1006  AST 37  ALT 52*  ALKPHOS 57  BILITOT 0.4  PROT 7.3  ALBUMIN 3.6    Urine analysis: No results found for: COLORURINE, APPEARANCEUR, LABSPEC, PHURINE, GLUCOSEU, HGBUR, BILIRUBINUR, KETONESUR, PROTEINUR, UROBILINOGEN, NITRITE, LEUKOCYTESUR  Radiological Exams on Admission: CT HEAD WO CONTRAST ( ) Result Date: 11/23/2023 CLINICAL DATA:  Altered mental status EXAM: CT HEAD WITHOUT CONTRAST TECHNIQUE: Contiguous axial images were obtained from the base of the skull through the vertex without intravenous contrast. RADIATION DOSE REDUCTION: This exam was performed according to the departmental dose-optimization program which includes automated exposure control, adjustment of the mA and/or kV according to patient size and/or use of iterative reconstruction technique. COMPARISON:  None Available. FINDINGS: Brain: No evidence of acute infarction, hemorrhage, mass, mass effect, or midline shift. No hydrocephalus or extra-axial fluid collection. Gray-white differentiation is preserved. Basal cisterns are patent. Vascular: No hyperdense vessel. Skull: Negative for fracture or focal lesion. Sinuses/Orbits: Mucous retention cysts in the maxillary sinuses. Mucosal thickening in the frontal sinuses and ethmoid air cells. No acute finding in the orbits. Other: The mastoid air cells are well aerated. IMPRESSION: No acute intracranial process. Electronically Signed   By: Donald Campion M.D.   On: 11/23/2023 12:18   DG Chest 2 View Result Date: 11/23/2023 CLINICAL DATA:  Altered mental status. EXAM: CHEST - 2 VIEW COMPARISON:  None Available. FINDINGS: Cardiac silhouette and mediastinal contours are within normal limits. The lungs are clear. No pleural effusion or pneumothorax.  Mild dextrocurvature centered at L1-2. IMPRESSION: No active cardiopulmonary disease. Electronically Signed   By: Tanda Lyons M.D.   On: 11/23/2023 11:22    EKG: Independently reviewed.  Sinus rhythm at 91 bpm.  Assessment/Plan Principal Problem:   Acute encephalopathy   Acute encephalopathy > As per HPI noted to be altered this morning when family noted patient was not responding to her alarms.  Initially blinks there and incomprehensible speech.  Some improvement in the ED but still confused and off baseline. > Unclear etiology with no electrolyte abnormality in the ED and no evidence of infection.  Normal Tylenol  level, aspirin level.  Negative EtOH screen, normal UDS.  Chest x-ray with no acute abnormality.  CT head with no acute abnormality.  Respiratory panel negative.  Patient did have some back pain yesterday with leg heaviness however  also had some upper extremity and now some confusion component so do not suspect a lumbar etiology. > Neurology consulted and recommended EEG and MRI.  Could represent prolonged ictal state if new onset seizures.  If workup negative could be psychologic component to her presentation. - Appreciate neurology recommendations and assistance - Monitor on telemetry overnight - Follow-up MRI brain - Follow-up EEG - Supportive care   DVT prophylaxis: SCDs for now Code Status:   Full Family Communication:  Updated at bedside. Disposition Plan:   Patient is from:  Home  Anticipated DC to:  Home  Anticipated DC date:  1 to 2 days  Anticipated DC barriers: None  Consults called:  Neurology Admission status:  Observation, telemetry  Severity of Illness: The appropriate patient status for this patient is OBSERVATION. Observation status is judged to be reasonable and necessary in order to provide the required intensity of service to ensure the patient's safety. The patient's presenting symptoms, physical exam findings, and initial radiographic and laboratory  data in the context of their medical condition is felt to place them at decreased risk for further clinical deterioration. Furthermore, it is anticipated that the patient will be medically stable for discharge from the hospital within 2 midnights of admission.    Marsa KATHEE Scurry MD [facility]  How to contact the [facility] Attending or Consulting provider 7A - 7P or covering provider during after hours 7P -7A, for this patient?   Check the care team in [facility] and look for a) attending/consulting [facility] provider listed and b) the [facility] team listed Log into [email or web address] and use [place]'s universal password to access. If you do not have the password, please contact the hospital operator. Locate the [facility] provider you are looking for under [facility] and page to a number that you can be directly reached. If you still have difficulty reaching the provider, please page the [facility] (Director on Call) for the Hospitalists listed on amion for assistance.  11/23/2023, 5:07 PM

## 2023-11-23 NOTE — ED Provider Notes (Signed)
  Physical Exam  BP 134/86   Pulse 96   Temp 98.1 F (36.7 C)   Resp 17   SpO2 90%   Physical Exam  Procedures  Procedures  ED Course / MDM   Clinical Course as of 11/23/23 1611  Tue Nov 23, 2023  0938 CXR, covid/flu, CT head, labs, etoh, drug screen, tylenol danae [WF]    Clinical Course User Index [WF] Neldon Hamp RAMAN, [name]   Medical Decision Making Amount and/or Complexity of Data Reviewed Labs: ordered. Radiology: ordered.  Risk Prescription drug management. Decision regarding hospitalization.   Woke with AMS Has abnormal neuro exam, now 9 hours later ? Sz with prolonged post-ictal ?neuro event Per neurology, patient should be admitted for EEG, MRI  Discussed with Dr. Seena, [facility]) who accepts for admission.        Odell Balls, PA-C 11/23/23 1712    Neysa Caron PARAS, DO 11/27/23 1104

## 2023-11-23 NOTE — ED Provider Notes (Signed)
 [place] EMERGENCY DEPARTMENT AT [facility] Provider Note   CSN: [id number] Arrival date & time: 11/23/23  9156     History  Chief Complaint  Patient presents with   AMS    [name] is a 33 y.o. female.  HPI  Patient was brought in by EMS due to AMS noted by family this morning. Patients family reports that patient started having cold like symptoms the day prior, mostly cough. Patient reported to have been experiencing body aches and fatigue. Lack of appetite as well; did not eat dinner. Patient reported to have been feeling heaviness in their legs. Pt was also trying to hold up a medium sized mirror the day prior and was seemingly struggling to do so. Patient family also reports that patient was complaining of lower back pain.  Patient alarms reportedly kept going off this morning which prompted someone to check on the patient; patient reportedly found laying in bed with a blank stare. Patient was speaking incomprehensible words and not making sense. Patients family reports that the patient has had some improvement between then and arrival at the hospital; pt is still seemingly disoriented but will occasionally speak a sentence. Family denies patient having a fever or N/V/D. No history of seizures. No recent trauma.                                                                                                                                                                                                       Home Medications Prior to Admission medications   Not on File      Allergies    Penicillins    Review of Systems   Review of Systems  Physical Exam Updated Vital Signs BP 134/86   Pulse 96   Temp 98.1 F (36.7 C)   Resp 17   SpO2 90%  Physical Exam Vitals and nursing note reviewed.  Constitutional:      General: She is not in acute distress.    Appearance: Normal appearance. She is obese.  HENT:     Head: Normocephalic and atraumatic.      Nose: Nose normal.     Mouth/Throat:     Comments: Small cut on tip of tongue no bleeding Eyes:     General: No scleral icterus. Cardiovascular:     Rate and Rhythm: Normal rate and regular rhythm.     Pulses: Normal pulses.     Heart sounds: Normal heart sounds.  Pulmonary:     Effort: Pulmonary effort is normal. No respiratory distress.  Breath sounds: No wheezing.  Abdominal:     Palpations: Abdomen is soft.     Tenderness: There is no abdominal tenderness.  Musculoskeletal:     Cervical back: Normal range of motion.     Right lower leg: No edema.     Left lower leg: No edema.  Skin:    General: Skin is warm and dry.     Capillary Refill: Capillary refill takes less than 2 seconds.  Neurological:     Mental Status: She is alert. Mental status is at baseline.     Comments: Nonfocal neuroexam  Patient moves all 4 extremities, smiles, is slow to follow commands but does so when the commands are clarified and repeated.  Orientation: Patient is oriented to self, incorrectly identifies year is 2013, incorrectly identifies month as 2013, and correctly identifies location as 2013. Patient does understand that she is confused and nods her head yes when asked if she is aware that she has same 2013 and appropriately.  Small pupils, symmetric, alert and non-drowsy   Psychiatric:        Mood and Affect: Mood normal.        Behavior: Behavior normal.     ED Results / Procedures / Treatments   Labs (all labs ordered are listed, but only abnormal results are displayed) Labs Reviewed  CBC - Abnormal; Notable for the following components:      Result Value   MCH 25.5 (*)    All other components within normal limits  COMPREHENSIVE METABOLIC PANEL - Abnormal; Notable for the following components:   Glucose, Bld 117 (*)    ALT 52 (*)    All other components within normal limits  ACETAMINOPHEN  LEVEL - Abnormal; Notable for the following components:   Acetaminophen  (Tylenol ), Serum  <10 (*)    All other components within normal limits  SALICYLATE LEVEL - Abnormal; Notable for the following components:   Salicylate Lvl <7.0 (*)    All other components within normal limits  I-STAT VENOUS BLOOD GAS, ED - Abnormal; Notable for the following components:   pH, Ven 7.471 (*)    pCO2, Ven 34.4 (*)    pO2, Ven 51 (*)    Calcium, Ion 1.13 (*)    All other components within normal limits  RESP PANEL BY RT-PCR (RSV, FLU A&B, COVID)  RVPGX2  PREGNANCY, URINE  RAPID URINE DRUG SCREEN, HOSP PERFORMED  ETHANOL    EKG EKG Interpretation Date/Time:  Tuesday November 23 2023 10:41:21 EST Ventricular Rate:  91 PR Interval:  156 QRS Duration:  82 QT Interval:  354 QTC Calculation: 435 R Axis:   42  Text Interpretation: Normal sinus rhythm Nonspecific T wave abnormality Abnormal ECG No previous ECGs available Confirmed by Laurice Coy [phone]) on 11/23/2023 3:08:29 PM  Radiology CT HEAD WO CONTRAST ( ) Result Date: 11/23/2023 CLINICAL DATA:  Altered mental status EXAM: CT HEAD WITHOUT CONTRAST TECHNIQUE: Contiguous axial images were obtained from the base of the skull through the vertex without intravenous contrast. RADIATION DOSE REDUCTION: This exam was performed according to the departmental dose-optimization program which includes automated exposure control, adjustment of the mA and/or kV according to patient size and/or use of iterative reconstruction technique. COMPARISON:  None Available. FINDINGS: Brain: No evidence of acute infarction, hemorrhage, mass, mass effect, or midline shift. No hydrocephalus or extra-axial fluid collection. Gray-white differentiation is preserved. Basal cisterns are patent. Vascular: No hyperdense vessel. Skull: Negative for fracture or focal lesion. Sinuses/Orbits: Mucous retention cysts in the  maxillary sinuses. Mucosal thickening in the frontal sinuses and ethmoid air cells. No acute finding in the orbits. Other: The mastoid air cells are well  aerated. IMPRESSION: No acute intracranial process. Electronically Signed   By: Donald Campion M.D.   On: 11/23/2023 12:18   DG Chest 2 View Result Date: 11/23/2023 CLINICAL DATA:  Altered mental status. EXAM: CHEST - 2 VIEW COMPARISON:  None Available. FINDINGS: Cardiac silhouette and mediastinal contours are within normal limits. The lungs are clear. No pleural effusion or pneumothorax. Mild dextrocurvature centered at L1-2. IMPRESSION: No active cardiopulmonary disease. Electronically Signed   By: Tanda Lyons M.D.   On: 11/23/2023 11:22    Procedures Procedures    Medications Ordered in ED Medications  ondansetron  (ZOFRAN ) injection 4 mg (4 mg Intravenous Given 11/23/23 1235)  sodium chloride  0.9 % bolus 1,000 mL (0 mLs Intravenous Stopped 11/23/23 1453)    ED Course/ Medical Decision Making/ A&P Clinical Course as of 11/23/23 1532  Tue Nov 23, 2023  0938 CXR, covid/flu, CT head, labs, etoh, drug screen, tylenol danae [WF]    Clinical Course User Index [WF] Neldon Hamp RAMAN, [name]                                 Medical Decision Making Amount and/or Complexity of Data Reviewed Labs: ordered. Radiology: ordered.  Risk Prescription drug management.   This patient presents to the ED for concern of AMS, this involves a number of treatment options, and is a complaint that carries with it a moderate to high risk of complications and morbidity. A differential diagnosis was considered for the patient's symptoms which is discussed below:   The differential diagnosis for AMS is extensive and includes, but is not limited to: drug overdose - opioids, alcohol, sedatives, antipsychotics, drug withdrawal, others; Metabolic: hypoxia, hypoglycemia, hyperglycemia, hypercalcemia, hypernatremia, hyponatremia, uremia, hepatic encephalopathy, hypothyroidism, hyperthyroidism, vitamin B12 or thiamine deficiency, carbon monoxide poisoning, Wilson's disease, Lactic acidosis, DKA/HHOS; Infectious: meningitis,  encephalitis, bacteremia/sepsis, urinary tract infection, pneumonia, neurosyphilis; Structural: Space-occupying lesion, (brain tumor, subdural hematoma, hydrocephalus,); Vascular: stroke, subarachnoid hemorrhage, coronary ischemia, hypertensive encephalopathy, CNS vasculitis, thrombotic thrombocytopenic purpura, disseminated intravascular coagulation, hyperviscosity; Psychiatric: Schizophrenia, depression; Other: Seizure, hypothermia, heat stroke, dementia    Co morbidities: Discussed in HPI   Brief History:  Patient was brought in by EMS due to AMS noted by family this morning. Patients family reports that patient started having cold like symptoms the day prior, mostly cough. Patient reported to have been experiencing body aches and fatigue. Lack of appetite as well; did not eat dinner. Patient reported to have been feeling heaviness in their legs. Pt was also trying to hold up a medium sized mirror the day prior and was seemingly struggling to do so. Patient family also reports that patient was complaining of lower back pain.  Patient alarms reportedly kept going off this morning which prompted someone to check on the patient; patient reportedly found laying in bed with a blank stare. Patient was speaking incomprehensible words and not making sense. Patients family reports that the patient has had some improvement between then and arrival at the hospital; pt is still seemingly disoriented but will occasionally speak a sentence. Family denies patient having a fever or N/V/D. No history of seizures. No recent trauma.  EMR reviewed including pt PMHx, past surgical history and past visits to ER.   See HPI for more details   Lab Tests:   Labs unremarkable urine drug screen negative, urine pregnancy negative, COVID RSV flu negative ethanol undetectable CMP CBC unremarkable Tylenol  salicylate levels normal.   Imaging Studies:  CT head and  chest x-ray unremarkable    Cardiac Monitoring:    EKG nonischemic, no interval abnormalities   Medicines ordered:  I ordered medication including Zofran , 1000 L normal saline for treatment of nausea, hydration Reevaluation of the patient after these medicines showed that the patient improved I have reviewed the patients home medicines and have made adjustments as needed   Critical Interventions:     Consults/Attending Physician   Dr. Laurice who recommends consultation with neurology.        Reevaluation:  After the interventions noted above I re-evaluated patient and found that they have :improved slow, gradual improvement.    Social Determinants of Health:      Problem List / ED Course:  Patient with altered mental status since this morning.  Has now been 9 hours since she was found to be altered.  Unknown how long she has been behaving strangely went to bed normal last night.  No head trauma.  CT head unremarkable and labs reassuring no evidence of metabolic abnormalities.  Patient does have small cut on her tongue.  Concern for seizure with slow return to normal. Patient care handed off to SU who will discuss w neurology.   Dispostion:    Final Clinical Impression(s) / ED Diagnoses Final diagnoses:  None    Rx / DC Orders ED Discharge Orders     None         Neldon Hamp RAMAN, [name] 11/23/23 1544    Neysa Caron PARAS, DO 11/23/23 1616

## 2023-11-23 NOTE — Progress Notes (Signed)
STAT EEG complete - results pending. ? ?

## 2023-11-24 ENCOUNTER — Observation Stay (HOSPITAL_COMMUNITY): Payer: 59

## 2023-11-24 ENCOUNTER — Inpatient Hospital Stay (HOSPITAL_COMMUNITY): Payer: 59

## 2023-11-24 DIAGNOSIS — R0902 Hypoxemia: Secondary | ICD-10-CM | POA: Diagnosis not present

## 2023-11-24 DIAGNOSIS — R5383 Other fatigue: Secondary | ICD-10-CM | POA: Diagnosis present

## 2023-11-24 DIAGNOSIS — E66813 Obesity, class 3: Secondary | ICD-10-CM | POA: Diagnosis present

## 2023-11-24 DIAGNOSIS — R4182 Altered mental status, unspecified: Secondary | ICD-10-CM | POA: Diagnosis not present

## 2023-11-24 DIAGNOSIS — G9341 Metabolic encephalopathy: Secondary | ICD-10-CM | POA: Diagnosis present

## 2023-11-24 DIAGNOSIS — M47812 Spondylosis without myelopathy or radiculopathy, cervical region: Secondary | ICD-10-CM | POA: Diagnosis present

## 2023-11-24 DIAGNOSIS — Z880 Allergy status to penicillin: Secondary | ICD-10-CM | POA: Diagnosis not present

## 2023-11-24 DIAGNOSIS — Z823 Family history of stroke: Secondary | ICD-10-CM | POA: Diagnosis not present

## 2023-11-24 DIAGNOSIS — R000 Tachycardia, unspecified: Secondary | ICD-10-CM | POA: Diagnosis not present

## 2023-11-24 DIAGNOSIS — R569 Unspecified convulsions: Secondary | ICD-10-CM | POA: Diagnosis not present

## 2023-11-24 DIAGNOSIS — R059 Cough, unspecified: Secondary | ICD-10-CM | POA: Diagnosis present

## 2023-11-24 DIAGNOSIS — E876 Hypokalemia: Secondary | ICD-10-CM | POA: Diagnosis not present

## 2023-11-24 DIAGNOSIS — G9340 Encephalopathy, unspecified: Secondary | ICD-10-CM | POA: Diagnosis present

## 2023-11-24 DIAGNOSIS — M545 Low back pain, unspecified: Secondary | ICD-10-CM | POA: Diagnosis present

## 2023-11-24 DIAGNOSIS — G030 Nonpyogenic meningitis: Secondary | ICD-10-CM | POA: Diagnosis present

## 2023-11-24 DIAGNOSIS — M50222 Other cervical disc displacement at C5-C6 level: Secondary | ICD-10-CM | POA: Diagnosis present

## 2023-11-24 DIAGNOSIS — Z1152 Encounter for screening for COVID-19: Secondary | ICD-10-CM | POA: Diagnosis not present

## 2023-11-24 LAB — COMPREHENSIVE METABOLIC PANEL
ALT: 36 U/L (ref 0–44)
AST: 25 U/L (ref 15–41)
Albumin: 3.5 g/dL (ref 3.5–5.0)
Alkaline Phosphatase: 57 U/L (ref 38–126)
Anion gap: 10 (ref 5–15)
BUN: 9 mg/dL (ref 6–20)
CO2: 23 mmol/L (ref 22–32)
Calcium: 8.7 mg/dL — ABNORMAL LOW (ref 8.9–10.3)
Chloride: 106 mmol/L (ref 98–111)
Creatinine, Ser: 0.79 mg/dL (ref 0.44–1.00)
GFR, Estimated: >60 mL/min (ref >60)
Glucose, Bld: 87 mg/dL (ref 70–99)
Potassium: 3.3 mmol/L — ABNORMAL LOW (ref 3.5–5.1)
Sodium: 139 mmol/L (ref 135–145)
Total Bilirubin: 1.1 mg/dL (ref 0.0–1.2)
Total Protein: 7.3 g/dL (ref 6.5–8.1)

## 2023-11-24 LAB — CBG MONITORING, ED: Glucose-Capillary: 109 mg/dL — ABNORMAL HIGH (ref 70–99)

## 2023-11-24 LAB — SEDIMENTATION RATE: Sed Rate: 34 mm/h — ABNORMAL HIGH (ref 0–22)

## 2023-11-24 LAB — GLUCOSE, CAPILLARY: Glucose-Capillary: 83 mg/dL (ref 70–99)

## 2023-11-24 LAB — HIV ANTIBODY (ROUTINE TESTING W REFLEX): HIV Screen 4th Generation wRfx: NONREACTIVE

## 2023-11-24 LAB — CBC
HCT: 39.5 % (ref 36.0–46.0)
Hemoglobin: 12.4 g/dL (ref 12.0–15.0)
MCH: 25.4 pg — ABNORMAL LOW (ref 26.0–34.0)
MCHC: 31.4 g/dL (ref 30.0–36.0)
MCV: 80.9 fL (ref 80.0–100.0)
Platelets: 274 10*3/uL (ref 150–400)
RBC: 4.88 MIL/uL (ref 3.87–5.11)
RDW: 14.3 % (ref 11.5–15.5)
WBC: 5.5 10*3/uL (ref 4.0–10.5)
nRBC: 0 % (ref 0.0–0.2)

## 2023-11-24 LAB — RPR: RPR Ser Ql: NONREACTIVE

## 2023-11-24 MED ORDER — LIDOCAINE HCL (PF) 1 % IJ SOLN
10.0000 mL | Freq: Once | INTRAMUSCULAR | Status: AC
  Administered 2023-11-26: 10 mL

## 2023-11-24 MED ORDER — SODIUM CHLORIDE 0.9 % IV SOLN: INTRAVENOUS | Status: AC

## 2023-11-24 MED ORDER — LIDOCAINE HCL (PF) 1 % IJ SOLN
5.0000 mL | Freq: Once | INTRAMUSCULAR | Status: AC
  Administered 2023-11-24: 10 mL

## 2023-11-24 MED ORDER — LIDOCAINE 1 % OPTIME INJ - NO CHARGE
5.0000 mL | Freq: Once | INTRAMUSCULAR | Status: AC
  Administered 2023-11-24: 5 mL via INTRADERMAL

## 2023-11-24 MED ORDER — LORAZEPAM 2 MG/ML IJ SOLN
2.0000 mg | Freq: Once | INTRAMUSCULAR | Status: AC | PRN
  Administered 2023-11-24: 2 mg via INTRAVENOUS
  Filled 2023-11-24: qty 1

## 2023-11-24 MED ORDER — LORAZEPAM 2 MG/ML IJ SOLN
1.0000 mg | Freq: Once | INTRAMUSCULAR | Status: AC | PRN
  Administered 2023-11-24: 1 mg via INTRAVENOUS
  Filled 2023-11-24: qty 1

## 2023-11-24 MED ORDER — DEXTROSE 50 % IV SOLN
1.0000 | INTRAVENOUS | Status: DC | PRN
Start: 2023-11-24 — End: 2023-11-27
  Administered 2023-11-25: 50 mL via INTRAVENOUS
  Filled 2023-11-24: qty 50

## 2023-11-24 NOTE — Plan of Care (Signed)
?  Problem: Education: Goal: Knowledge of General Education information will improve Description: Including pain rating scale, medication(s)/side effects and non-pharmacologic comfort measures Outcome: Progressing   Problem: Clinical Measurements: Goal: Respiratory complications will improve Outcome: Progressing   Problem: Activity: Goal: Risk for activity intolerance will decrease Outcome: Progressing   Problem: Elimination: Goal: Will not experience complications related to urinary retention Outcome: Progressing   

## 2023-11-24 NOTE — Progress Notes (Signed)
 Pt back from MRI. Pt more awake. Able to stand up and brush teeth. Mouth still has dried blood from bite trauma.

## 2023-11-24 NOTE — Procedures (Addendum)
 Patient Name: Deborah Cooper  MRN: [id number]  Epilepsy Attending: Arlin MALVA Krebs  Referring Physician/Provider: Merrianne Locus, MD   Duration: 11/23/2023 2321 to 11/24/2023 1303   Patient history: 32yo F with ams. EEG to evaluate for seizure   Level of alertness: Awake, asleep   AEDs during EEG study: None   Technical aspects: This EEG study was done with scalp electrodes positioned according to the 10-20 International system of electrode placement. Electrical activity was reviewed with band pass filter of 1-70Hz , sensitivity of 7 uV/mm, display speed of 58mm/sec with a 60Hz  notched filter applied as appropriate. EEG data were recorded continuously and digitally stored.  Video monitoring was available and reviewed as appropriate.   Description: The posterior dominant rhythm consists of 8 Hz activity of moderate voltage (25-35 uV) seen predominantly in posterior head regions, symmetric and reactive to eye opening and eye closing. Sleep was characterized by vertex waves, sleep spindles (12 to 14 Hz), maximal frontocentral region. EEG showed intermittent generalized predominantly 5 to 7 Hz theta slowing admixed with intermittent 2-3hz  delta slowing. Hyperventilation and photic stimulation were not performed.      ABNORMALITY - Intermittent slow, generalized   IMPRESSION: This study is suggestive of mild diffuse encephalopathy. No seizures or epileptiform discharges were seen throughout the recording.   Shaquanda Graves O Yerlin Gasparyan

## 2023-11-24 NOTE — Progress Notes (Signed)
 NEUROLOGY CONSULT FOLLOW UP NOTE   Date of service: November 24, 2023 Patient Name: Deborah Cooper MRN:  [id number] DOB:  August 08, 1991  Interval Hx/subjective  Continues to be encephalopathic. Frequent fidgeting and shifting in bed overnight.   Vitals   Vitals:   11/24/23 0605 11/24/23 0612 11/24/23 0725 11/24/23 1050  BP: 101/65  114/64 117/71  Pulse: 99  100 (!) 101  Resp: 16  (!) 22 17  Temp:  98.8 F (37.1 C) 97.9 F (36.6 C) 98.7 F (37.1 C)  TempSrc:  Axillary  Oral  SpO2: 93%  94% 98%     There is no height or weight on file to calculate BMI.  Physical Exam   Physical Exam  HEENT:  [place]/AT. No nuchal rigidity. Oral mucosa with decreased hydration.  Lungs: Respirations unlabored Skin: Warm to touch.  Extremities: Warm and well perfused.   Neurologic Examination   Mental status exam: Patient continues to be disoriented. Drowsy to somnolent. Able to answer questions about how she is feeling, but not in detail. She has poor attention as evident by conversation and exam with patient. Memory is poor. Executive function is poor and patient is not able to describe abstract thoughts.  Speech: Speech fluent but slow, with mild dysarthria in the context of depressed level of consciousness.  Cranial Nerves: II:  PERRL. Fixates on visual stimuli.  III,IV, VI: Ptosis not present, extra-ocular motions intact bilaterally V,VII: Smile symmetric, facial light touch sensation normal bilaterally VIII: Hearing intact to voice bilaterally IX,X: No hypophonia or hoarseness XI: Symmetric XII: Midline tongue  Motor: Right :  Upper extremity   5/5                                      Left:     Upper extremity   5/5             Lower extremity   5/5                                                  Lower extremity   5/5 Tone and bulk:normal tone throughout; no atrophy noted Sensory: Light touch intact throughout, bilaterally Deep Tendon Reflexes: Normoactive.  Cerebellar: No appendicular  ataxia or tremor noted.   Gait: Deferred  Medications  Current Facility-Administered Medications:    acetaminophen  (TYLENOL ) tablet 650 mg, 650 mg, Oral, Q6H PRN **OR** acetaminophen  (TYLENOL ) suppository 650 mg, 650 mg, Rectal, Q6H PRN, Melvin, Alexander B, MD   dextrose  50 % solution 50 mL, 1 ampule, Intravenous, PRN, Sundil, Subrina, MD   LORazepam  (ATIVAN ) injection 2 mg, 2 mg, Intravenous, Q6H PRN, Sundil, Subrina, MD, 2 mg at 11/24/23 0133   polyethylene glycol (MIRALAX  / GLYCOLAX ) packet 17 g, 17 g, Oral, Daily PRN, Melvin, Alexander B, MD   sodium chloride  flush (NS) 0.9 % injection 3 mL, 3 mL, Intravenous, Q12H, Melvin, Alexander B, MD, 3 mL at 11/24/23 1056 No current outpatient medications on file.  Labs and Diagnostic Imaging   CBC:  Recent Labs  Lab 11/23/23 1006 11/23/23 1047  WBC 7.3  --   HGB 12.5 13.3  HCT 40.6 39.0  MCV 82.9  --   PLT 277  --     Basic Metabolic Panel:  Lab Results  Component Value Date   NA 141 11/23/2023   K 3.7 11/23/2023   CO2 23 11/23/2023   GLUCOSE 117 (H) 11/23/2023   BUN 8 11/23/2023   CREATININE 0.70 11/23/2023   CALCIUM 8.9 11/23/2023   GFRNONAA >60 11/23/2023   Lipid Panel: No results found for: LDLCALC HgbA1c: No results found for: HGBA1C Urine Drug Screen:     Component Value Date/Time   LABOPIA NONE DETECTED 11/23/2023 1441   COCAINSCRNUR NONE DETECTED 11/23/2023 1441   LABBENZ NONE DETECTED 11/23/2023 1441   AMPHETMU NONE DETECTED 11/23/2023 1441   THCU NONE DETECTED 11/23/2023 1441   LABBARB NONE DETECTED 11/23/2023 1441    Alcohol Level     Component Value Date/Time   ETH <10 11/23/2023 1006    Assessment  Deborah Cooper is a 33 y.o. female with no past medical history who presents with disorientation and impaired attention in the setting of recent upper respiratory infection symptoms. 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   - On exam today, she continues to be encephalopathic and overall appears slightly worse than yesterday. This correlates with the continuous slowing on EEG. No asterixis on exam, but does appear somewhat drowsy. No nuchal rigidity.  - DDx for event at home includes subclinical seizure followed by postictal state, viral encephalitis or toxidrome.  - ALT elevated, but AST and T. Bili were normal. Ca and Na are normal. Glucose was mildly elevated at 117 on admission, normal today at 87. BUN and Cr normal. WBC continues to be normal. UDS negative.   - ESR elevated at 34. CRP normal at 0.6.  - CT head is normal.   - Vitals have been stable and labs have been unremarkable decreasing likelihood for electrolyte disturbances, infectious causes, endocrine disorders, and toxicities. EEG was not consistent with seizure and thus is suggestive of nonepileptic etiology. Patient did have some risk factors for CNS infection including preceding respiratory infection and spinal pain prior to admission but continues to be afebrile with no meningismus on exam. - Given that she has not improved overnight, will need to obtain an LP to assess for possible autoimmune encephalopathy. This will need to be under fluoroscopic guidance due to morbid obesity and mild agitation.  - Overall impression: Encephalopathy of unknown etiology, suspect possible new-onset subclinical seizure activity versus autoimmune cause (DDx would include Hashimoto's thyroiditis, CNS lupus, neurosarcoidosis and anti-NMDA receptor encephalitis) - LTM EEG report for today AM (Wednesday): Intermittent slow, generalized. This study is suggestive of mild diffuse encephalopathy. No seizures or epileptiform discharges were seen throughout the recording.  Recommendations  - Lumbar puncture is indicated. She is morbidly obese, requiring fluoroscopic guidance. - Fluoro-guided LP has been ordered.   - Patient encephalopathic and unable to give consent.  Mother has signed consent form after detailed discussion of risks/benefits with her.  - May need sedation for the procedure due to difficulty following commands and poor attention with spontaneous fidgeting, but she is overall cooperative, not agitated and follows commands.  - CSF labs ordered: Protein, glucose, cell count, bacterial and fungal cultures, gram stain, OCBs, VDRL, cytology, acid fast bacillus, cryptococcal antigen, IgG index, anti-NMDA receptor antibody titer and CSF meningitis/encephalitis PCR panel.  - Discontinuing LTM EEG - MRI brain with and without contrast (ordered)  Addendum: - Unsuccessful attempt at fluoro-guided lumbar puncture, despite multiple attempts by 2 providers. Patient did not tolerate the procedure well. -  Will need to repeat attempt at LP tomorrow. If unable to obtain on second attempt, will need to discuss empiric steroids with family.  ______________________________________________________________________   Bonney SHARK, Willoughby Doell, MD [facility]

## 2023-11-24 NOTE — Progress Notes (Signed)
 vLTM started by [facility]

## 2023-11-24 NOTE — Procedures (Signed)
 PROCEDURE SUMMARY:  Unsuccessful attempt at fluoroscopic guided lumbar puncture. Deborah Cooper, [name] and [place], NP both attempted different sites and were unable to achieve return of CSF, despite amenable anatomy. The needle was felt in the thecal sac on multiple occasions. No immediate complications.  Pt tolerated procedure poorly, and was unable to maintain a steady position, requiring repositioning of needle multiple times.   EBL = none  Please see full dictation in imaging section of Epic for procedure details.

## 2023-11-24 NOTE — Progress Notes (Addendum)
 PROGRESS NOTE   Deborah Cooper  FMW:[id number]    DOB: December 12, 1990    DOA: 11/23/2023  PCP: Patient, No Pcp Per   I have briefly reviewed patients previous medical records in [facility].  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  Admitted for acute encephalopathy of unclear etiology.  Extensive workup thus far negative for cause.  Neurology consulting.   Assessment & Plan:  Principal Problem:   Acute encephalopathy   Acute encephalopathy, unclear etiology History as noted above CT head 2/4 without acute findings. MRI brain with and without contrast ordered but patient unable to cooperate for it yet LTM EEG: Suggestive of mild diffuse encephalopathy without seizures or epileptiform discharges Extensive workup as follows unremarkable: CRP, vitamin B12, ammonia level, VBG without CO2 retention, no leukocytosis, negative serum acetaminophen  and salicylate level.  Negative blood alcohol level.  UDS negative.  Urine pregnancy and HIV screen negative.  RPR nonreactive.  TSH 1.767.  No leukocytosis. Workup still in progress: NMDA, anti-Jo antibody, ENA. Neurology initial consultation note appreciated.  Felt that suspected possible these included new onset subclinical seizure activity versus autoimmune causes.??  Nonorganic/functional etiology. As per father at bedside, patient has been mostly somnolent.  Alert and oriented x 3 but not very interactive and may not be her baseline yet.  Addendum: Unsuccessful LP under fluoroscopy by IR despite Ativan  1 mg x 1 Discussed with Dr. Lindzen in person.  He recommends  getting MRI brain with and without contrast with Ativan  2 mg IV x 1 sedation prior.   Neurology plans on repeating LP on 2/6 pending IV fluid hydration. Remains n.p.o. until patient consistently alert enough to take orally safely.  Will need to be n.p.o. after midnight for LP. Discussed with patient's RN.   There is no height or weight on file to calculate BMI.   DVT prophylaxis: SCDs Start: 11/23/23 1706     Code Status: Full Code:  Family Communication: Father at bedside Disposition:  Status is: Observation The patient will require care spanning > 2 midnights and should be moved to inpatient because: Ongoing LTM EEG, although mental status is better, is not yet back to baseline.     Consultants:   Neurology  Procedures:   LTM EEG  Antimicrobials:      Subjective:  Seen this morning while still in ED room along with patient's female RN and patient's father at bedside.  Somnolent but easily arousable.  Oriented to person, place and year.  Quickly drifts back to sleep.  Not very interactive.  Objective:   Vitals:   11/24/23 0132 11/24/23 0605 11/24/23 0612 11/24/23 0725  BP:  101/65  114/64  Pulse:  99  100  Resp:  16  (!) 22  Temp: 99 F (37.2 C)  98.8 F (37.1 C) 97.9 F (36.6 C)  TempSrc: Axillary  Axillary   SpO2:  93%  94%    General exam: Young female, moderately built and obese, lying comfortably propped up in bed without distress.  Oral mucosa with borderline hydration. Respiratory system: Clear to auscultation. Respiratory effort normal. Cardiovascular system: S1 & S2 heard, RRR. No  JVD, murmurs, rubs, gallops or clicks. No pedal edema.  Telemetry personally reviewed: Sinus rhythm. Gastrointestinal system: Abdomen is nondistended, soft and nontender. No organomegaly or masses felt. Normal bowel sounds heard. Central nervous system: Mental status as noted above. No focal neurological deficits. Extremities: Symmetric 5 x 5 power. Skin: No rashes, lesions or  ulcers Psychiatry: Judgement and insight appear normal. Mood & affect appropriate.     Data Reviewed:   I have personally reviewed following labs and imaging studies   CBC: Recent Labs  Lab 11/23/23 1006 11/23/23 1047  WBC 7.3  --   HGB 12.5 13.3  HCT 40.6 39.0  MCV 82.9  --   PLT 277  --     Basic Metabolic Panel: Recent Labs  Lab 11/23/23 1006 11/23/23 1047  NA 138 141  K 3.6 3.7  CL 105  --   CO2 23  --   GLUCOSE 117*  --   BUN 8  --   CREATININE 0.70  --   CALCIUM 8.9  --     Liver Function Tests: Recent Labs  Lab 11/23/23 1006  AST 37  ALT 52*  ALKPHOS 57  BILITOT 0.4  PROT 7.3  ALBUMIN 3.6    CBG: Recent Labs  Lab 11/24/23 0723  GLUCAP 109*    Microbiology Studies:   Recent Results (from the past 240 hours)  Resp panel by RT-PCR (RSV, Flu A&B, Covid) Anterior Nasal Swab     Status: None   Collection Time: 11/23/23 10:07 AM   Specimen: Anterior Nasal Swab  Result Value Ref Range Status   SARS Coronavirus 2 by RT PCR NEGATIVE NEGATIVE Final   Influenza A by PCR NEGATIVE NEGATIVE Final   Influenza B by PCR NEGATIVE NEGATIVE Final    Comment: (NOTE) The Xpert Xpress SARS-CoV-2/FLU/RSV plus assay is intended as an aid in the diagnosis of influenza from Nasopharyngeal swab specimens and should not be used as a sole basis for treatment. Nasal washings and aspirates are unacceptable for Xpert Xpress SARS-CoV-2/FLU/RSV testing.  Fact Sheet for Patients: bloggercourse.com  Fact Sheet for Healthcare Providers: seriousbroker.it  This test is not yet approved or cleared by the [place]  FDA and has been authorized for detection and/or diagnosis of SARS-CoV-2 by FDA under an Emergency Use Authorization (EUA). This EUA will remain in effect (meaning this test can be used) for the duration of the COVID-19 declaration under Section 564(b)(1) of the Act, 21 U.S.C. section 360bbb-3(b)(1), unless  the authorization is terminated or revoked.     Resp Syncytial Virus by PCR NEGATIVE NEGATIVE Final    Comment: (NOTE) Fact Sheet for Patients: bloggercourse.com  Fact Sheet for Healthcare Providers: seriousbroker.it  This test is not yet approved or cleared by the [place]  FDA and has been authorized for detection and/or diagnosis of SARS-CoV-2 by FDA under an Emergency Use Authorization (EUA). This EUA will remain in effect (meaning this test can be used) for the duration of the COVID-19 declaration under Section 564(b)(1) of the Act, 21 U.S.C. section 360bbb-3(b)(1), unless the authorization is terminated or revoked.  Performed at [facility], [address] [place], [place] [address]     Radiology Studies:  Overnight EEG with video Result Date: 11/24/2023 Shelton Arlin KIDD, MD     11/24/2023  8:54 AM Patient Name: ROKHAYA QUINN MRN: [id number] Epilepsy Attending: Arlin KIDD Shelton Referring Physician/Provider: Merrianne Locus, MD  Duration: 11/23/2023 2321 to 11/24/2023 9149  Patient history: 32yo F with ams. EEG  to evaluate for seizure  Level of alertness: Awake, asleep  AEDs during EEG study: None  Technical aspects: This EEG study was done with scalp electrodes positioned according to the 10-20 International system of electrode placement. Electrical activity was reviewed with band pass filter of 1-70Hz , sensitivity of 7 uV/mm, display speed of 30mm/sec with a 60Hz  notched filter applied as appropriate. EEG data were recorded continuously and digitally stored.  Video monitoring was available and reviewed as appropriate.  Description: The posterior dominant rhythm consists of 8 Hz activity of moderate voltage (25-35 uV) seen predominantly in posterior head regions, symmetric and reactive to eye opening and eye closing. Sleep was characterized by vertex waves, sleep spindles (12 to 14 Hz), maximal frontocentral region. EEG showed  intermittent generalized predominantly 5 to 7 Hz theta slowing admixed with intermittent 2-3hz  delta slowing. Hyperventilation and photic stimulation were not performed.    ABNORMALITY - Intermittent slow, generalized  IMPRESSION: This study is suggestive of mild diffuse encephalopathy. No seizures or epileptiform discharges were seen throughout the recording.  Arlin MALVA Krebs    EEG adult Result Date: 11/23/2023 Krebs Arlin MALVA, MD     11/23/2023  6:20 PM Patient Name: ARLEATHA PHILIPPS MRN: [id number] Epilepsy Attending: Arlin MALVA Krebs Referring Physician/Provider: Merrianne Locus, MD Date: 11/22/2022 Duration: 23.37 mins Patient history: 33yo F with ams. EEG to evaluate for seizure Level of alertness: Awake AEDs during EEG study: None Technical aspects: This EEG study was done with scalp electrodes positioned according to the 10-20 International system of electrode placement. Electrical activity was reviewed with band pass filter of 1-70Hz , sensitivity of 7 uV/mm, display speed of 29mm/sec with a 60Hz  notched filter applied as appropriate. EEG data were recorded continuously and digitally stored.  Video monitoring was available and reviewed as appropriate. Description: The posterior dominant rhythm consists of 8 Hz activity of moderate voltage (25-35 uV) seen predominantly in posterior head regions, symmetric and reactive to eye opening and eye closing. EEG showed continuous generalized predominantly 5 to 7 Hz theta slowing admixed with intermittent 2-3hz  delta slowing. Hyperventilation and photic stimulation were not performed.   ABNORMALITY - Continuous slow, generalized IMPRESSION: This study is suggestive of mild to moderate diffuse encephalopathy. No seizures or epileptiform discharges were seen throughout the recording. Priyanka MALVA Krebs   CT HEAD WO CONTRAST ( ) Result Date: 11/23/2023 CLINICAL DATA:  Altered mental status EXAM: CT HEAD WITHOUT CONTRAST TECHNIQUE: Contiguous axial images were obtained from  the base of the skull through the vertex without intravenous contrast. RADIATION DOSE REDUCTION: This exam was performed according to the departmental dose-optimization program which includes automated exposure control, adjustment of the mA and/or kV according to patient size and/or use of iterative reconstruction technique. COMPARISON:  None Available. FINDINGS: Brain: No evidence of acute infarction, hemorrhage, mass, mass effect, or midline shift. No hydrocephalus or extra-axial fluid collection. Gray-white differentiation is preserved. Basal cisterns are patent. Vascular: No hyperdense vessel. Skull: Negative for fracture or focal lesion. Sinuses/Orbits: Mucous retention cysts in the maxillary sinuses. Mucosal thickening in the frontal sinuses and ethmoid air cells. No acute finding in the orbits. Other: The mastoid air cells are well aerated. IMPRESSION: No acute intracranial process. Electronically Signed   By: Donald Campion M.D.   On: 11/23/2023 12:18   DG Chest 2 View Result Date: 11/23/2023 CLINICAL DATA:  Altered mental status. EXAM: CHEST - 2 VIEW COMPARISON:  None Available. FINDINGS: Cardiac silhouette and mediastinal contours are within normal limits. The lungs are clear. No  pleural effusion or pneumothorax. Mild dextrocurvature centered at L1-2. IMPRESSION: No active cardiopulmonary disease. Electronically Signed   By: Tanda Lyons M.D.   On: 11/23/2023 11:22    Scheduled Meds:    sodium chloride  flush  3 mL Intravenous Q12H    Continuous Infusions:     LOS: 0 days     Trenda Mar, MD,  FACP, [facility], [name], [facility]   [facility] [place]      To contact the attending provider between 7A-7P or the covering provider during after hours 7P-7A, please log into the web site [email or web address] and access using universal  password for that web site. If you do not have the password, please call the hospital operator.  11/24/2023, 10:10 AM

## 2023-11-24 NOTE — Progress Notes (Signed)
 Pt given Ativan  2mg . Pt placed on 2LNC as O2 was 92% on RA. Pt going down to MRI.

## 2023-11-24 NOTE — Progress Notes (Signed)
 LTM EEG discontinued - no skin breakdown at [facility].

## 2023-11-24 NOTE — ED Notes (Signed)
 Inpatient floor made aware that patient is going to IR prior to moving to the floor.

## 2023-11-24 NOTE — ED Notes (Signed)
 Patient unhooked from EEG by EEG technician. Patient awake and answering questions with RN and family. Patient ready to move to IR at this time.

## 2023-11-24 NOTE — Progress Notes (Signed)
 Patient received from ED, alert and oriented X2, vital signs stable, tele box connected, family members at bedside, call bell within reach, will continue to monitor

## 2023-11-25 ENCOUNTER — Inpatient Hospital Stay (HOSPITAL_COMMUNITY): Payer: 59

## 2023-11-25 DIAGNOSIS — R4182 Altered mental status, unspecified: Secondary | ICD-10-CM | POA: Diagnosis not present

## 2023-11-25 DIAGNOSIS — E876 Hypokalemia: Secondary | ICD-10-CM | POA: Diagnosis not present

## 2023-11-25 DIAGNOSIS — G9340 Encephalopathy, unspecified: Secondary | ICD-10-CM | POA: Diagnosis not present

## 2023-11-25 LAB — BASIC METABOLIC PANEL
Anion gap: 7 (ref 5–15)
BUN: 8 mg/dL (ref 6–20)
CO2: 22 mmol/L (ref 22–32)
Calcium: 8.4 mg/dL — ABNORMAL LOW (ref 8.9–10.3)
Chloride: 109 mmol/L (ref 98–111)
Creatinine, Ser: 0.82 mg/dL (ref 0.44–1.00)
GFR, Estimated: >60 mL/min (ref >60)
Glucose, Bld: 85 mg/dL (ref 70–99)
Potassium: 3.4 mmol/L — ABNORMAL LOW (ref 3.5–5.1)
Sodium: 138 mmol/L (ref 135–145)

## 2023-11-25 LAB — EXTRACTABLE NUCLEAR ANTIGEN ANTIBODY
ENA SM Ab Ser-aCnc: <0.2 AI (ref 0.0–0.9)
Ribonucleic Protein: <0.2 AI (ref 0.0–0.9)
SSA (Ro) (ENA) Antibody, IgG: <0.2 AI (ref 0.0–0.9)
SSB (La) (ENA) Antibody, IgG: <0.2 AI (ref 0.0–0.9)
Scleroderma (Scl-70) (ENA) Antibody, IgG: <0.2 AI (ref 0.0–0.9)
ds DNA Ab: <1 [IU]/mL (ref 0–9)

## 2023-11-25 LAB — N-METHYL-D-ASPARTATE RECPT.IGG
N-methyl-D-Aspartate Recpt.IgG: NEGATIVE
N-methyl-D-Aspartate Recpt.IgG: NEGATIVE

## 2023-11-25 LAB — ANTI-JO 1 ANTIBODY, IGG: Anti JO-1: <0.2 AI (ref 0.0–0.9)

## 2023-11-25 LAB — POTASSIUM: Potassium: 3.7 mmol/L (ref 3.5–5.1)

## 2023-11-25 LAB — GLUCOSE, CAPILLARY
Glucose-Capillary: 166 mg/dL — ABNORMAL HIGH (ref 70–99)
Glucose-Capillary: 69 mg/dL — ABNORMAL LOW (ref 70–99)
Glucose-Capillary: 84 mg/dL (ref 70–99)
Glucose-Capillary: 84 mg/dL (ref 70–99)
Glucose-Capillary: 90 mg/dL (ref 70–99)

## 2023-11-25 LAB — MAGNESIUM: Magnesium: 2 mg/dL (ref 1.7–2.4)

## 2023-11-25 MED ORDER — DEXTROSE 5 % IV SOLN: INTRAVENOUS | Status: DC

## 2023-11-25 MED ORDER — SODIUM CHLORIDE 0.9 % IV SOLN: INTRAVENOUS | Status: DC

## 2023-11-25 MED ORDER — GADOBUTROL 1 MMOL/ML IV SOLN
9.0000 mL | Freq: Once | INTRAVENOUS | Status: AC | PRN
  Administered 2023-11-25: 9 mL via INTRAVENOUS

## 2023-11-25 MED ORDER — POTASSIUM CHLORIDE 10 MEQ/100ML IV SOLN
10.0000 meq | INTRAVENOUS | Status: AC
  Administered 2023-11-25 (×4): 10 meq via INTRAVENOUS
  Filled 2023-11-25 (×4): qty 100

## 2023-11-25 MED ORDER — SODIUM CHLORIDE 0.9 % IV BOLUS
1000.0000 mL | Freq: Once | INTRAVENOUS | Status: AC
  Administered 2023-11-25: 1000 mL via INTRAVENOUS

## 2023-11-25 NOTE — Progress Notes (Signed)
 PROGRESS NOTE   Deborah Cooper  FMW:[id number]    DOB: [birth year]    DOA: 11/23/2023  PCP: Patient, No Pcp Per   I have briefly reviewed patients previous medical records in [facility].  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  Admitted for acute encephalopathy of unclear etiology.  Extensive workup thus far negative for cause.  Neurology consulting.  Slowly improving.     Assessment & Plan:  Principal Problem:   Acute encephalopathy   Acute encephalopathy, unclear etiology History as noted above CT head 2/4 without acute findings. MRI head without and with contrast: Intermittently motion degraded.  Unremarkable MRI appearance of the brain.  No acute intracranial abnormality. MRI C-spine: Significantly motion degraded.  No lesion identified within the cervical spinal cord. LTM VIDEO EEG: Suggestive of mild diffuse encephalopathy without seizures or epileptiform discharges Extensive workup as follows unremarkable: CRP, vitamin B12, ammonia level, VBG without CO2 retention, no leukocytosis, negative serum acetaminophen  and salicylate level.  Negative blood alcohol level.  UDS negative.  Urine pregnancy and HIV screen negative.  RPR nonreactive.  TSH 1.767.  No leukocytosis. Workup still in progress: NMDA, anti-Jo antibody, ENA. Neurology initial consultation note appreciated.  Felt that suspected possible these included new onset subclinical seizure activity versus autoimmune causes.?  Postviral Unsuccessful fluoroscopic guided LP by IR yesterday.  Neurology plans repeat LP today post IV hydration. Mental  status slowly improving. Remains n.p.o. due to ongoing sedation, in the context of her encephalopathy and Ativan  given for procedures/imaging.  Plan to initiate diet when alert and safe to tolerate orally. While n.p.o., IV fluids, D5W initiated due to CBG of 69. Will check respiratory panel.  Hypokalemia Replace IV and follow Check and replace magnesium as needed.  There is no height or weight on file to calculate BMI.   DVT prophylaxis: SCDs Start: 11/23/23 1706     Code Status: Full Code:  Family Communication: Mother at bedside Disposition:  Ongoing evaluation for acute encephalopathy of undetermined etiology.  LP pending.  Remains on IV fluids while NPO.     Consultants:   Neurology  Procedures:   LTM EEG  Antimicrobials:   None   Subjective:  Seen this morning along with mother at bedside.  Was sleeping but arousable, oriented to person, place, partly to time (month and year).  Unable to clearly state as to why she was in the hospital.  As per mother, patient was recently exposed to a sick child at her workplace last weekend and a couple days later she had severe headache, nasal stuffiness, sore throat, body aches.  She feels that patient's mental status is improved by about 50% and more so after IV fluids.  Objective:   Vitals:   11/25/23 0428 11/25/23 0700 11/25/23 0800 11/25/23 1408  BP: 112/66 123/63  (!) 92/55  Pulse: 100 90  98  Resp:  18  18  Temp: 98.6 F (37 C) 98.6 F (37 C)  97.8 F (36.6 C)  TempSrc: Axillary Axillary  Oral  SpO2: 95% 93% 96% 96%  Height:        General exam: Young female, moderately  built and obese, lying comfortably propped up in bed without distress.  Oral mucosa moist Respiratory system: Clear to auscultation.  No increased work of breathing Cardiovascular system: S1 & S2 heard, RRR. No JVD, murmurs, rubs, gallops or clicks. No pedal edema.  Telemetry personally reviewed: Sinus rhythm, occasional mild sinus tachycardia.   Discontinued telemetry. Gastrointestinal system: Abdomen is nondistended, soft and nontender. No organomegaly or masses felt. Normal bowel sounds heard. Central nervous system: Mental status as noted above. No focal neurological deficits. Extremities: Symmetric 5 x 5 power. Skin: No rashes, lesions or ulcers Psychiatry: Judgement and insight impaired. Mood & affect unable to assess.     Data Reviewed:   I have personally reviewed following labs and imaging studies   CBC: Recent Labs  Lab 11/23/23 1006 11/23/23 1047 11/24/23 1443  WBC 7.3  --  5.5  HGB 12.5 13.3 12.4  HCT 40.6 39.0 39.5  MCV 82.9  --  80.9  PLT 277  --  274    Basic Metabolic Panel: Recent Labs  Lab 11/23/23 1006 11/23/23 1047 11/24/23 1443 11/25/23 0630  NA 138 141 139 138  K 3.6 3.7 3.3* 3.4*  CL 105  --  106 109  CO2 23  --  23 22  GLUCOSE 117*  --  87 85  BUN 8  --  9 8  CREATININE 0.70  --  0.79 0.82  CALCIUM 8.9  --  8.7* 8.4*    Liver Function Tests: Recent Labs  Lab 11/23/23 1006 11/24/23 1443  AST 37 25  ALT 52* 36  ALKPHOS 57 57  BILITOT 0.4 1.1  PROT 7.3 7.3  ALBUMIN 3.6 3.5    CBG: Recent Labs  Lab 11/25/23 0047 11/25/23 0615 11/25/23 1358  GLUCAP 84 84 69*    Microbiology Studies:   Recent Results (from the past 240 hours)  Resp panel by RT-PCR (RSV, Flu A&B, Covid) Anterior Nasal Swab     Status: None   Collection Time: 11/23/23 10:07 AM   Specimen: Anterior Nasal Swab  Result Value Ref Range Status   SARS Coronavirus 2 by RT PCR NEGATIVE NEGATIVE Final   Influenza A by PCR NEGATIVE NEGATIVE Final   Influenza B by PCR NEGATIVE NEGATIVE Final    Comment: (NOTE) The Xpert Xpress SARS-CoV-2/FLU/RSV plus assay is intended as an aid in the diagnosis of influenza from Nasopharyngeal swab specimens and should not be used as a sole basis for treatment. Nasal washings and aspirates are unacceptable for Xpert Xpress SARS-CoV-2/FLU/RSV testing.  Fact Sheet for  Patients: bloggercourse.com  Fact Sheet for Healthcare Providers: seriousbroker.it  This test is not yet approved or cleared by the [place]  FDA and has been authorized for detection and/or diagnosis of SARS-CoV-2 by FDA under an Emergency Use Authorization (EUA). This EUA will remain in effect (meaning this test can be used) for the duration of the COVID-19 declaration under Section 564(b)(1) of the Act, 21 U.S.C. section 360bbb-3(b)(1), unless the authorization is terminated or revoked.     Resp Syncytial Virus by PCR NEGATIVE NEGATIVE Final    Comment: (NOTE) Fact Sheet for Patients: bloggercourse.com  Fact Sheet for Healthcare Providers: seriousbroker.it  This test is not yet approved or cleared by the [place]  FDA and has been authorized for detection and/or diagnosis of SARS-CoV-2 by FDA under an Emergency Use Authorization (EUA). This EUA will remain in effect (meaning this test can be used) for the duration of the COVID-19 declaration under Section 564(b)(1) of the Act,  21 U.S.C. section 360bbb-3(b)(1), unless the authorization is terminated or revoked.  Performed at [facility], [address] [place], [place] [address]     Radiology Studies:  MR BRAIN W WO CONTRAST Result Date: 11/25/2023 CLINICAL DATA:  Provided history: Meningitis/CNS infection suspected. Possible autoimmune encephalitis. Demyelinating disease. EXAM: MRI HEAD WITHOUT AND WITH CONTRAST MRI CERVICAL SPINE WITHOUT AND WITH CONTRAST TECHNIQUE: Multiplanar, multiecho pulse sequences of the brain and surrounding structures, and cervical spine, to include the craniocervical junction and cervicothoracic junction, were obtained without and with intravenous contrast. CONTRAST:  9mL GADAVIST  GADOBUTROL  1 MMOL/ML IV SOLN COMPARISON:  Head CT 11/23/2023. FINDINGS: MRI HEAD FINDINGS Intermittently  motion degraded examination (with up to moderate motion degradation of the acquired sequences). Within this limitation, findings are as follows. Brain: Cerebral volume is normal. No cortical encephalomalacia is identified. No significant cerebral white matter disease. There is no acute infarct. No evidence of an intracranial mass. No chronic intracranial blood products. No extra-axial fluid collection. No midline shift. No pathologic intracranial enhancement identified. Vascular: Maintained flow voids within the proximal large arterial vessels. Small developmental venous anomaly within the right cerebellar hemisphere (anatomic variant). Skull and upper cervical spine: No focal worrisome marrow lesion. Sinuses/Orbits: No mass or acute finding within the imaged orbits. 15 mm mucous retention cyst within the right maxillary sinus. Small mucous retention cyst within the left maxillary sinus. Mild mucosal thickening within the bilateral sphenoid, bilateral ethmoid and bilateral frontal sinuses. MRI CERVICAL SPINE FINDINGS Intermittently motion degraded examination. Most notably, the axial T2 FRFSE sequence is moderate-to-severely motion degraded. Within this limitation, findings are as follows. Alignment: No significant spondylolisthesis. Vertebrae: Cervical vertebral body height is maintained. No significant marrow edema or focal worrisome marrow lesion. Cord: Within limitations of significant motion degradation, no signal abnormality is identified within the cervical spinal cord. No pathologic spinal cord enhancement. Posterior Fossa, vertebral arteries, paraspinal tissues: Posterior fossa assessed on concurrently performed brain MRI. Flow voids preserved within the imaged cervical vertebral arteries. No paraspinal mass or collection. Disc levels: No more than mild disc degeneration within the cervical spine. C2-C3: No significant disc herniation or stenosis. C3-C4: No significant disc herniation or stenosis. C4-C5: A  small central disc protrusion mildly effaces the ventral thecal sac. No significant foraminal stenosis. C5-C6: No significant disc herniation or stenosis. C6-C7: Slight disc bulge. No significant spinal canal or foraminal stenosis. C7-T1:  No significant disc herniation or stenosis. IMPRESSION: MRI brain: 1. Intermittently motion degraded examination. Within this limitation, there is an unremarkable MRI appearance of the brain. No evidence of an acute intracranial abnormality. 2. Paranasal sinus disease as described. MRI cervical spine: 1. Significantly motion degraded examination. Within this limitation, no lesion is identified within the cervical spinal cord. 2. Mild cervical spondylosis as described. Most notably at C5-C6, a central disc protrusion mildly effaces the ventral thecal sac. Electronically Signed   By: Rockey Childs D.O.   On: 11/25/2023 13:56   MR CERVICAL SPINE W WO CONTRAST Result Date: 11/25/2023 CLINICAL DATA:  Provided history: Meningitis/CNS infection suspected. Possible autoimmune encephalitis. Demyelinating disease. EXAM: MRI HEAD WITHOUT AND WITH CONTRAST MRI CERVICAL SPINE WITHOUT AND WITH CONTRAST TECHNIQUE: Multiplanar, multiecho pulse sequences of the brain and surrounding structures, and cervical spine, to include the craniocervical junction and cervicothoracic junction, were obtained without and with intravenous contrast. CONTRAST:  9mL GADAVIST  GADOBUTROL  1 MMOL/ML IV SOLN COMPARISON:  Head CT 11/23/2023. FINDINGS: MRI HEAD FINDINGS Intermittently motion degraded examination (with up to moderate motion degradation  of the acquired sequences). Within this limitation, findings are as follows. Brain: Cerebral volume is normal. No cortical encephalomalacia is identified. No significant cerebral white matter disease. There is no acute infarct. No evidence of an intracranial mass. No chronic intracranial blood products. No extra-axial fluid collection. No midline shift. No pathologic  intracranial enhancement identified. Vascular: Maintained flow voids within the proximal large arterial vessels. Small developmental venous anomaly within the right cerebellar hemisphere (anatomic variant). Skull and upper cervical spine: No focal worrisome marrow lesion. Sinuses/Orbits: No mass or acute finding within the imaged orbits. 15 mm mucous retention cyst within the right maxillary sinus. Small mucous retention cyst within the left maxillary sinus. Mild mucosal thickening within the bilateral sphenoid, bilateral ethmoid and bilateral frontal sinuses. MRI CERVICAL SPINE FINDINGS Intermittently motion degraded examination. Most notably, the axial T2 FRFSE sequence is moderate-to-severely motion degraded. Within this limitation, findings are as follows. Alignment: No significant spondylolisthesis. Vertebrae: Cervical vertebral body height is maintained. No significant marrow edema or focal worrisome marrow lesion. Cord: Within limitations of significant motion degradation, no signal abnormality is identified within the cervical spinal cord. No pathologic spinal cord enhancement. Posterior Fossa, vertebral arteries, paraspinal tissues: Posterior fossa assessed on concurrently performed brain MRI. Flow voids preserved within the imaged cervical vertebral arteries. No paraspinal mass or collection. Disc levels: No more than mild disc degeneration within the cervical spine. C2-C3: No significant disc herniation or stenosis. C3-C4: No significant disc herniation or stenosis. C4-C5: A small central disc protrusion mildly effaces the ventral thecal sac. No significant foraminal stenosis. C5-C6: No significant disc herniation or stenosis. C6-C7: Slight disc bulge. No significant spinal canal or foraminal stenosis. C7-T1:  No significant disc herniation or stenosis. IMPRESSION: MRI brain: 1. Intermittently motion degraded examination. Within this limitation, there is an unremarkable MRI appearance of the brain. No  evidence of an acute intracranial abnormality. 2. Paranasal sinus disease as described. MRI cervical spine: 1. Significantly motion degraded examination. Within this limitation, no lesion is identified within the cervical spinal cord. 2. Mild cervical spondylosis as described. Most notably at C5-C6, a central disc protrusion mildly effaces the ventral thecal sac. Electronically Signed   By: Rockey Childs D.O.   On: 11/25/2023 13:56   DG FL GUIDED LUMBAR PUNCTURE Result Date: 11/24/2023 CLINICAL DATA:  33 year old admitted with acute encephalopathy. Requested for lumbar puncture for rule out meningitis EXAM: DIAGNOSTIC LUMBAR PUNCTURE UNDER FLUOROSCOPIC GUIDANCE COMPARISON:  None Available. FLUOROSCOPY: Radiation Exposure Index (as provided by the fluoroscopic device): 37.5 mGy Kerma PROCEDURE: Informed consent was obtained from the patient prior to the procedure, including potential complications of headache, allergy, and pain. With the patient prone, the lower back was prepped with Betadine. 1% Lidocaine  was used for local anesthesia. Lumbar puncture was performed at the L3-L4 and L4-L5 level using a 20 gauge needle with unsuccessful return of CSF. Charles Millstone and Jamie Covington both attempted multiple locations. The needle was felt in the thecal sac on multiple occasions, without return of CSF. The patient tolerated the procedure poorly, and was unable to lay still, requiring complete repositioning of needle on 2 occasions. IMPRESSION: Unsuccessful attempt at lumbar puncture, despite multiple attempts by 2 providers. Patient did not tolerate the procedure well. Procedure performed by Carlin Griffon, PA-C Electronically Signed   By: Ester Sides M.D.   On: 11/24/2023 16:05   Overnight EEG with video Result Date: 11/24/2023 Shelton Arlin KIDD, MD     11/25/2023 12:10 PM Patient Name: Deborah Cooper MRN: [id number] Epilepsy  Attending: Arlin MALVA Krebs Referring Physician/Provider: Merrianne Locus, MD  Duration:  11/23/2023 2321 to 11/24/2023 1303  Patient history: 32yo F with ams. EEG to evaluate for seizure  Level of alertness: Awake, asleep  AEDs during EEG study: None  Technical aspects: This EEG study was done with scalp electrodes positioned according to the 10-20 International system of electrode placement. Electrical activity was reviewed with band pass filter of 1-70Hz , sensitivity of 7 uV/mm, display speed of 15mm/sec with a 60Hz  notched filter applied as appropriate. EEG data were recorded continuously and digitally stored.  Video monitoring was available and reviewed as appropriate.  Description: The posterior dominant rhythm consists of 8 Hz activity of moderate voltage (25-35 uV) seen predominantly in posterior head regions, symmetric and reactive to eye opening and eye closing. Sleep was characterized by vertex waves, sleep spindles (12 to 14 Hz), maximal frontocentral region. EEG showed intermittent generalized predominantly 5 to 7 Hz theta slowing admixed with intermittent 2-3hz  delta slowing. Hyperventilation and photic stimulation were not performed.    ABNORMALITY - Intermittent slow, generalized  IMPRESSION: This study is suggestive of mild diffuse encephalopathy. No seizures or epileptiform discharges were seen throughout the recording.  Arlin MALVA Krebs    EEG adult Result Date: 11/23/2023 Krebs Arlin MALVA, MD     11/23/2023  6:20 PM Patient Name: Deborah Cooper MRN: [id number] Epilepsy Attending: Arlin MALVA Krebs Referring Physician/Provider: Merrianne Locus, MD Date: 11/22/2022 Duration: 23.37 mins Patient history: 33yo F with ams. EEG to evaluate for seizure Level of alertness: Awake AEDs during EEG study: None Technical aspects: This EEG study was done with scalp electrodes positioned according to the 10-20 International system of electrode placement. Electrical activity was reviewed with band pass filter of 1-70Hz , sensitivity of 7 uV/mm, display speed of 34mm/sec with a 60Hz  notched filter applied as  appropriate. EEG data were recorded continuously and digitally stored.  Video monitoring was available and reviewed as appropriate. Description: The posterior dominant rhythm consists of 8 Hz activity of moderate voltage (25-35 uV) seen predominantly in posterior head regions, symmetric and reactive to eye opening and eye closing. EEG showed continuous generalized predominantly 5 to 7 Hz theta slowing admixed with intermittent 2-3hz  delta slowing. Hyperventilation and photic stimulation were not performed.   ABNORMALITY - Continuous slow, generalized IMPRESSION: This study is suggestive of mild to moderate diffuse encephalopathy. No seizures or epileptiform discharges were seen throughout the recording. Priyanka O Yadav    Scheduled Meds:    lidocaine  (PF)  10 mL Other Once   sodium chloride  flush  3 mL Intravenous Q12H    Continuous Infusions:    sodium chloride  125 mL/hr at 11/25/23 9061   dextrose      potassium chloride        LOS: 1 day     Trenda Mar, MD,  FACP, [facility], [facility], [facility]   [facility] [place]      To contact the attending provider between 7A-7P or the covering provider during after hours 7P-7A, please log into the web site [email or web address] and access using universal [place] password for that web site. If you do not have the password, please call the hospital operator.  11/25/2023, 2:27 PM

## 2023-11-25 NOTE — Plan of Care (Signed)
  Problem: Education: Goal: Knowledge of General Education information will improve Description: Including pain rating scale, medication(s)/side effects and non-pharmacologic comfort measures Outcome: Progressing   Problem: Health Behavior/Discharge Planning: Goal: Ability to manage health-related needs will improve Outcome: Progressing   Problem: Clinical Measurements: Goal: Respiratory complications will improve Outcome: Progressing   Problem: Activity: Goal: Risk for activity intolerance will decrease Outcome: Progressing   Problem: Nutrition: Goal: Adequate nutrition will be maintained Outcome: Progressing   Problem: Elimination: Goal: Will not experience complications related to bowel motility Outcome: Progressing Goal: Will not experience complications related to urinary retention Outcome: Progressing   Problem: Pain Managment: Goal: General experience of comfort will improve and/or be controlled Outcome: Progressing   Problem: Safety: Goal: Ability to remain free from injury will improve Outcome: Progressing   Problem: Coping: Goal: Level of anxiety will decrease Outcome: Progressing

## 2023-11-25 NOTE — Progress Notes (Signed)
Patient back to the unit.

## 2023-11-25 NOTE — Progress Notes (Signed)
 NEUROLOGY CONSULT FOLLOW UP NOTE   Date of service: November 25, 2023 Patient Name: Deborah Cooper MRN:  [id number] DOB:  Aug 30, 1991  Interval Hx/subjective  More awake and less confused than yesterday, per family.   Vitals   Vitals:   11/25/23 0100 11/25/23 0428 11/25/23 0700 11/25/23 0800  BP:  112/66 123/63   Pulse:  100 90   Resp:   18   Temp:  98.6 F (37 C) 98.6 F (37 C)   TempSrc:  Axillary Axillary   SpO2: 96% 95% 93% 96%  Height:         There is no height or weight on file to calculate BMI.  Physical Exam   Physical Exam  HEENT:  [place]/AT. No nuchal rigidity. Oral mucosa with decreased hydration.  Lungs: Respirations unlabored Skin: No rash seen to face, neck, arms or upper chest  Extremities: Warm and well perfused.  Neurologic Examination   Mental status exam: Fully oriented x 5 today. Using longer sentences and phrases to converse today and vocalizations less hypophonic/monotone. Speech is fluent with intact naming and comprehension. Drowsy, an improvement from the somnolence seen yesterday. Able to recite the months of the year forwards without difficulty in about 10 seconds. Was able to recite all months of the year backwards, but did require long pauses to collect her thoughts and remember the next month back during this task, which took about 2 minutes to complete.  Speech: Speech fluent but slow, with mild dysarthria in the context of depressed level of consciousness.  Cranial Nerves: II:  PERRL. Fixates and tracks visual stimuli.  III,IV, VI: Ptosis not present, extra-ocular motions intact bilaterally V,VII: Smile symmetric, facial light touch sensation normal bilaterally VIII: Hearing intact to voice bilaterally IX,X: No hypophonia or hoarseness XI: Symmetric XII: Midline tongue  Motor: Right :  Upper extremity   5/5                                      Left:     Upper extremity   5/5             Lower extremity   5/5                                                   Lower extremity   5/5 Tone and bulk:normal tone throughout; no atrophy noted Sensory: Light touch intact throughout, bilaterally Deep Tendon Reflexes: 2+ bilateral brachioradialis and biceps. Trace bilateral patellars.  Cerebellar: No appendicular ataxia or tremor noted.   Gait: Deferred  Medications  Current Facility-Administered Medications:    0.9 %  sodium chloride  infusion, , Intravenous, Continuous, Hongalgi, Anand D, MD, Last Rate: 125 mL/hr at 11/25/23 0938, New Bag at 11/25/23 9061   acetaminophen  (TYLENOL ) tablet 650 mg, 650 mg, Oral, Q6H PRN **OR** acetaminophen  (TYLENOL ) suppository 650 mg, 650 mg, Rectal, Q6H PRN, Melvin, Alexander B, MD   dextrose  50 % solution 50 mL, 1 ampule, Intravenous, PRN, Sundil, Subrina, MD   lidocaine  (PF) (XYLOCAINE ) 1 % injection 10 mL, 10 mL, Other, Once, Jakyren Fluegge, MD   LORazepam  (ATIVAN ) injection 2 mg, 2 mg, Intravenous, Q6H PRN, Sundil, Subrina, MD, 2 mg at 11/24/23 0133   polyethylene glycol (MIRALAX  / GLYCOLAX ) packet 17 g, 17  g, Oral, Daily PRN, Melvin, Alexander B, MD   sodium chloride  0.9 % bolus 1,000 mL, 1,000 mL, Intravenous, Once, Merrianne Locus, MD   sodium chloride  flush (NS) 0.9 % injection 3 mL, 3 mL, Intravenous, Q12H, Melvin, Alexander B, MD, 3 mL at 11/24/23 2131  Labs and Diagnostic Imaging   CBC:  Recent Labs  Lab 11/23/23 1006 11/23/23 1047 11/24/23 1443  WBC 7.3  --  5.5  HGB 12.5 13.3 12.4  HCT 40.6 39.0 39.5  MCV 82.9  --  80.9  PLT 277  --  274    Basic Metabolic Panel:  Lab Results  Component Value Date   NA 138 11/25/2023   K 3.4 (L) 11/25/2023   CO2 22 11/25/2023   GLUCOSE 85 11/25/2023   BUN 8 11/25/2023   CREATININE 0.82 11/25/2023   CALCIUM 8.4 (L) 11/25/2023   GFRNONAA >60 11/25/2023   Lipid Panel: No results found for: LDLCALC HgbA1c: No results found for: HGBA1C Urine Drug Screen:     Component Value Date/Time   LABOPIA NONE DETECTED 11/23/2023 1441   COCAINSCRNUR NONE  DETECTED 11/23/2023 1441   LABBENZ NONE DETECTED 11/23/2023 1441   AMPHETMU NONE DETECTED 11/23/2023 1441   THCU NONE DETECTED 11/23/2023 1441   LABBARB NONE DETECTED 11/23/2023 1441    Alcohol Level     Component Value Date/Time   [facility] <10 11/23/2023 1006     Assessment  Deborah Cooper is a 33 y.o. female with no past medical history who presents with disorientation and impaired attention in the setting of recent upper respiratory infection symptoms. 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   - On exam today, she is significantly improved relative to yesterday, but still drowsy and with mildly decreased concentration.  - Labs: ALT elevated, but AST and T. Bili were normal. Ca and Na are normal. Glucose was mildly elevated at 117 on admission, normal today at 87. BUN and Cr normal. WBC continues to be normal. UDS negative. ESR elevated at 34. CRP normal at 0.6. UDS negative on 2/4. Influenza, RSV and Covid negative. HIV negative. Pregnancy test negative. Lupus panel negative. RPR negative. TSH normal. B12 normal.  - CT head was normal.   - Will need to obtain an LP to assess for possible autoimmune encephalopathy. This will need to be under fluoroscopic guidance due to morbid obesity and mild agitation. Initial attempt was unsuccessful.  - LTM EEG report for Wednesday AM: Intermittent slow, generalized. This study is suggestive of mild diffuse encephalopathy. No seizures or epileptiform discharges were seen throughout the recording. LTM EEG was discontinued on Wednesday.  - DDx: - DDx for event at home includes subclinical seizure followed by postictal state, viral encephalitis, autoimmune encephalitis or toxidrome.  - During her inpatient stay, vitals have been stable except for mild intermittent hypotension and intermittently elevated HR between 101-109 and labs have been unremarkable decreasing  likelihood for electrolyte disturbances, infectious causes, endocrine disorders, and toxicities. Elevated ESR does somewhat increase the probability of an autoimmune etiology. EEG was not consistent with seizure and thus is suggestive of nonepileptic etiology. Patient did have some risk factors for CNS infection including preceding respiratory infection and spinal pain prior to admission but continues to be afebrile with no meningismus on exam. - Overall impression: Encephalopathy of unknown etiology, suspect possible new-onset subclinical seizure activity versus autoimmune cause (DDx would include Hashimoto's thyroiditis, CNS  lupus, neurosarcoidosis and anti-NMDA receptor encephalitis)  Recommendations  - MRI brain and cervical spine with and without contrast (ordered) - Antithyroglobulin antibody level (ordered) - Serum anti-NMDA receptor antibody level (ordered) - Serum paraneoplastic panel (ordered)    Addendum: - MRI brain w/wo contrast: Intermittently motion degraded examination. Within this limitation, there is an unremarkable MRI appearance of the brain. No evidence of an acute intracranial abnormality. Paranasal sinus disease. - MRI cervical spine: Significantly motion degraded examination. Within this limitation, no lesion is identified within the cervical spinal cord. Mild cervical spondylosis as described. Most notably at C5-C6, a central disc protrusion mildly effaces the ventral thecal sac. - Will need to repeat attempt at LP tomorrow. CSF labs ordered: Protein, glucose, cell count, bacterial and fungal cultures, gram stain, OCBs, VDRL, cytology, acid fast bacillus, cryptococcal antigen, IgG index, anti-NMDA receptor antibody titer and CSF meningitis/encephalitis PCR panel.  - If unable to obtain CSF on second attempt, will need to discuss empiric steroids with family if she is not back to baseline tomorrow.    ______________________________________________________________________   Bonney SHARK, Derwin Reddy, MD [facility]

## 2023-11-25 NOTE — Progress Notes (Signed)
 Patient was in RA after she came from MRI as Spo2 was >95% in RA, at around 1900 saturating 89-91%,so kept in [place] @2lt .

## 2023-11-25 NOTE — Progress Notes (Signed)
Patient off the unit for MRI.

## 2023-11-25 NOTE — Plan of Care (Signed)
  Problem: Education: Goal: Knowledge of General Education information will improve Description: Including pain rating scale, medication(s)/side effects and non-pharmacologic comfort measures Outcome: Progressing   Problem: Clinical Measurements: Goal: Respiratory complications will improve Outcome: Progressing Goal: Cardiovascular complication will be avoided Outcome: Progressing   Problem: Activity: Goal: Risk for activity intolerance will decrease Outcome: Progressing   Problem: Elimination: Goal: Will not experience complications related to urinary retention Outcome: Progressing   Problem: Skin Integrity: Goal: Risk for impaired skin integrity will decrease Outcome: Progressing

## 2023-11-26 ENCOUNTER — Inpatient Hospital Stay (HOSPITAL_COMMUNITY): Payer: 59

## 2023-11-26 DIAGNOSIS — G9340 Encephalopathy, unspecified: Secondary | ICD-10-CM | POA: Diagnosis not present

## 2023-11-26 DIAGNOSIS — E876 Hypokalemia: Secondary | ICD-10-CM | POA: Diagnosis not present

## 2023-11-26 DIAGNOSIS — R4182 Altered mental status, unspecified: Secondary | ICD-10-CM | POA: Diagnosis not present

## 2023-11-26 DIAGNOSIS — R0902 Hypoxemia: Secondary | ICD-10-CM | POA: Diagnosis not present

## 2023-11-26 LAB — CSF CELL COUNT WITH DIFFERENTIAL
Eosinophils, CSF: 0 % (ref 0–1)
Eosinophils, CSF: 0 % (ref 0–1)
Lymphs, CSF: 98 % — ABNORMAL HIGH (ref 40–80)
Lymphs, CSF: 99 % — ABNORMAL HIGH (ref 40–80)
Monocyte-Macrophage-Spinal Fluid: 1 % — ABNORMAL LOW (ref 15–45)
Monocyte-Macrophage-Spinal Fluid: 2 % — ABNORMAL LOW (ref 15–45)
RBC Count, CSF: 21 /mm3 — ABNORMAL HIGH
RBC Count, CSF: 34 /mm3 — ABNORMAL HIGH
Segmented Neutrophils-CSF: 0 % (ref 0–6)
Segmented Neutrophils-CSF: 0 % (ref 0–6)
Tube #: 1
Tube #: 4
WBC, CSF: 19 /mm3 (ref 0–5)
WBC, CSF: 29 /mm3 (ref 0–5)

## 2023-11-26 LAB — RESPIRATORY PANEL BY PCR

## 2023-11-26 LAB — MENINGITIS/ENCEPHALITIS PANEL (CSF)

## 2023-11-26 LAB — BASIC METABOLIC PANEL
Anion gap: 7 (ref 5–15)
BUN: <5 mg/dL — ABNORMAL LOW (ref 6–20)
CO2: 22 mmol/L (ref 22–32)
Calcium: 8.6 mg/dL — ABNORMAL LOW (ref 8.9–10.3)
Chloride: 108 mmol/L (ref 98–111)
Creatinine, Ser: 0.66 mg/dL (ref 0.44–1.00)
GFR, Estimated: >60 mL/min (ref >60)
Glucose, Bld: 107 mg/dL — ABNORMAL HIGH (ref 70–99)
Potassium: 3.5 mmol/L (ref 3.5–5.1)
Sodium: 137 mmol/L (ref 135–145)

## 2023-11-26 LAB — GLUCOSE, CAPILLARY
Glucose-Capillary: 100 mg/dL — ABNORMAL HIGH (ref 70–99)
Glucose-Capillary: 108 mg/dL — ABNORMAL HIGH (ref 70–99)
Glucose-Capillary: 111 mg/dL — ABNORMAL HIGH (ref 70–99)
Glucose-Capillary: 131 mg/dL — ABNORMAL HIGH (ref 70–99)
Glucose-Capillary: 97 mg/dL (ref 70–99)

## 2023-11-26 LAB — CRYPTOCOCCAL ANTIGEN, CSF: Crypto Ag: NEGATIVE

## 2023-11-26 LAB — PROTEIN AND GLUCOSE, CSF
Glucose, CSF: 62 mg/dL (ref 40–70)
Total  Protein, CSF: 42 mg/dL (ref 15–45)

## 2023-11-26 LAB — HEMOGLOBIN A1C
Hgb A1c MFr Bld: 5.5 % (ref 4.8–5.6)
Mean Plasma Glucose: 111.15 mg/dL

## 2023-11-26 MED ORDER — DEXTROSE 5 % IV SOLN: INTRAVENOUS | Status: AC

## 2023-11-26 MED ORDER — LIDOCAINE HCL (PF) 1 % IJ SOLN: 10.0000 mL | Freq: Once | INTRAMUSCULAR | Status: DC

## 2023-11-26 NOTE — Plan of Care (Signed)
  Problem: Education: Goal: Knowledge of General Education information will improve Description Including pain rating scale, medication(s)/side effects and non-pharmacologic comfort measures Outcome: Progressing   Problem: Clinical Measurements: Goal: Ability to maintain clinical measurements within normal limits will improve Outcome: Progressing Goal: Will remain free from infection Outcome: Progressing Goal: Diagnostic test results will improve Outcome: Progressing Goal: Respiratory complications will improve Outcome: Progressing Goal: Cardiovascular complication will be avoided Outcome: Progressing   Problem: Activity: Goal: Risk for activity intolerance will decrease Outcome: Progressing   Problem: Nutrition: Goal: Adequate nutrition will be maintained Outcome: Progressing   Problem: Coping: Goal: Level of anxiety will decrease Outcome: Progressing   Problem: Elimination: Goal: Will not experience complications related to bowel motility Outcome: Progressing Goal: Will not experience complications related to urinary retention Outcome: Progressing   Problem: Safety: Goal: Ability to remain free from injury will improve Outcome: Progressing   

## 2023-11-26 NOTE — TOC Initial Note (Signed)
 Transition of Care [facility]) - Initial/Assessment Note    Patient Details  Name: Deborah Cooper MRN: [id number] Date of Birth: [birth year]  [facility] CM/SW Contact:    Andrez JULIANNA George, RN Phone Number: 11/26/2023, 12:11 PM  Clinical Narrative:                  Pt is from home with her parents and siblings. She has someone with her most of the time.  No DME at home. She wasn't taking any prescription medications prior to admission. She denies issues with transportation. No PCP: CM was able to get her an appointment at [facility] clinic. Information on the AVS.  Pt has transportation home at dc.   Expected Discharge Plan: Home/Self Care Barriers to Discharge: Continued Medical Work up   Patient Goals and CMS Choice            Expected Discharge Plan and Services   Discharge Planning Services: CM Consult   Living arrangements for the past 2 months: Single Family Home                                      Prior Living Arrangements/Services Living arrangements for the past 2 months: Single Family Home Lives with:: Siblings, Parents Patient language and need for interpreter reviewed:: Yes Do you feel safe going back to the place where you live?: Yes        Care giver support system in place?: Yes (comment)   Criminal Activity/Legal Involvement Pertinent to Current Situation/Hospitalization: No - Comment as needed  Activities of Daily Living   ADL Screening (condition at time of admission) Independently performs ADLs?: Yes (appropriate for developmental age) Is the patient deaf or have difficulty hearing?: No Does the patient have difficulty seeing, even when wearing glasses/contacts?: No Does the patient have difficulty concentrating, remembering, or making decisions?: No  Permission Sought/Granted                  Emotional Assessment Appearance:: Appears stated age Attitude/Demeanor/Rapport: Engaged Affect (typically observed):  Accepting Orientation: : Oriented to Self, Oriented to Place, Oriented to  Time, Oriented to Situation   Psych Involvement: No (comment)  Admission diagnosis:  Acute encephalopathy [G93.40] Altered mental status, unspecified altered mental status type [R41.82] Patient Active Problem List   Diagnosis Date Noted   Acute encephalopathy 11/23/2023   PCP:  Patient, No Pcp Per Pharmacy:   [id number] - [place] ([place]), [place] - [address]  ([place]) [place] [address] Phone: [phone] Fax: [phone]     Social Drivers of Health (SDOH) Social History: SDOH Screenings   Food Insecurity: No Food Insecurity (11/24/2023)  Housing: Low Risk  (11/24/2023)  Transportation Needs: No Transportation Needs (11/24/2023)  Utilities: Not At Risk (11/24/2023)  Tobacco Use: Low Risk  (11/23/2023)   SDOH Interventions:     Readmission Risk Interventions     No data to display

## 2023-11-26 NOTE — Progress Notes (Signed)
 Dr. Lindzen made aware of CSF Tube 1 WBC 19 and Tube 4 WBC 29. Provider paged and awaiting orders. Anders Katz 11/26/23 6:39 PM

## 2023-11-26 NOTE — Procedures (Signed)
 PROCEDURE SUMMARY:  Successful fluoroscopic guided lumbar puncture. No immediate complications.  Pt tolerated well.   EBL = none  Please see full dictation in imaging section of Epic for procedure details.

## 2023-11-26 NOTE — Progress Notes (Addendum)
 NEUROLOGY CONSULT FOLLOW UP NOTE   Date of service: November 26, 2023 Patient Name: Deborah Cooper MRN:  [id number] DOB:  1991-01-07  Interval Hx/subjective  Mental status continues to improve. Had a meal yesterday night. No nausea, vomiting, headache or neck stiffness.   Vitals   Vitals:   11/25/23 2039 11/25/23 2349 11/26/23 0400 11/26/23 0735  BP: 90/60 92/61 93/62  105/65  Pulse: 93 91 92 88  Resp: 16 17 16 18   Temp: 98.2 F (36.8 C) 98.2 F (36.8 C) 98.1 F (36.7 C) 97.9 F (36.6 C)  TempSrc: Oral Oral Oral Oral  SpO2: 98% 99% 99% 95%  Height:         There is no height or weight on file to calculate BMI.  Physical Exam   Physical Exam  HEENT:  [place]/AT. No nuchal rigidity. Oral mucosa with decreased hydration. Tongue bite vs aphthous ulcer to lateral aspect of distal tongue on the right.  Lungs: Respirations unlabored Skin: No rash seen to face, neck, arms or upper chest  Extremities: Warm and well perfused.   Neurologic Examination    Mental status exam: Awake and alert. Fully oriented x 5. Speech is fluent with intact comprehension and naming. No dysarthria. Good insight. No longer drowsy. Attention and concentration are intact. Able to recite the months of the year backwards, in under 15 seconds, significantly improved since yesterday.   Cranial Nerves: II:   Fixates and tracks normally. Temporal visual fields intact with no extinction to DSS.   III,IV, VI: Ptosis not present, EOMI V,VII: Smile symmetric VIII: Hearing intact to voice  IX,X: No hypophonia or hoarseness XI: Symmetric XII: Midline tongue  Motor: Right :  Upper extremity   5/5                                      Left:     Upper extremity   5/5             Lower extremity   5/5                                                  Lower extremity   5/5 Tone and bulk:normal tone throughout; no atrophy noted Sensory: Light touch intact throughout, bilaterally Deep Tendon Reflexes: Normoactive Cerebellar:  No appendicular ataxia or tremor noted.   Gait: Deferred  Medications  Current Facility-Administered Medications:    0.9 %  sodium chloride  infusion, , Intravenous, Continuous, Niall Illes, MD, Last Rate: 75 mL/hr at 11/26/23 0014, Infusion Verify at 11/26/23 0014   acetaminophen  (TYLENOL ) tablet 650 mg, 650 mg, Oral, Q6H PRN **OR** acetaminophen  (TYLENOL ) suppository 650 mg, 650 mg, Rectal, Q6H PRN, Melvin, Alexander B, MD   dextrose  5 % solution, , Intravenous, Continuous, Judeth Trenda BIRCH, MD, Last Rate: 100 mL/hr at 11/26/23 0014, Infusion Verify at 11/26/23 0014   dextrose  50 % solution 50 mL, 1 ampule, Intravenous, PRN, Sundil, Subrina, MD, 50 mL at 11/25/23 1401   lidocaine  (PF) (XYLOCAINE ) 1 % injection 10 mL, 10 mL, Other, Once, Corinthia Helmers, MD   LORazepam  (ATIVAN ) injection 2 mg, 2 mg, Intravenous, Q6H PRN, Sundil, Subrina, MD, 2 mg at 11/24/23 0133   polyethylene glycol (MIRALAX  / GLYCOLAX ) packet 17 g, 17 g, Oral, Daily PRN,  Seena Marsa NOVAK, MD   sodium chloride  flush (NS) 0.9 % injection 3 mL, 3 mL, Intravenous, Q12H, Melvin, Alexander B, MD, 3 mL at 11/25/23 2244  Labs and Diagnostic Imaging   CBC:  Recent Labs  Lab 11/23/23 1006 11/23/23 1047 11/24/23 1443  WBC 7.3  --  5.5  HGB 12.5 13.3 12.4  HCT 40.6 39.0 39.5  MCV 82.9  --  80.9  PLT 277  --  274    Basic Metabolic Panel:  Lab Results  Component Value Date   NA 137 11/26/2023   K 3.5 11/26/2023   CO2 22 11/26/2023   GLUCOSE 107 (H) 11/26/2023   BUN <5 (L) 11/26/2023   CREATININE 0.66 11/26/2023   CALCIUM 8.6 (L) 11/26/2023   GFRNONAA >60 11/26/2023   Lipid Panel: No results found for: LDLCALC HgbA1c: No results found for: HGBA1C Urine Drug Screen:     Component Value Date/Time   LABOPIA NONE DETECTED 11/23/2023 1441   COCAINSCRNUR NONE DETECTED 11/23/2023 1441   LABBENZ NONE DETECTED 11/23/2023 1441   AMPHETMU NONE DETECTED 11/23/2023 1441   THCU NONE DETECTED 11/23/2023 1441    LABBARB NONE DETECTED 11/23/2023 1441    Alcohol Level     Component Value Date/Time   ETH <10 11/23/2023 1006   I  Assessment  Deborah Cooper is a 33 y.o. female with no past medical history who presents with disorientation and impaired attention in the setting of recent upper respiratory infection symptoms. 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   - On exam today, her mentation is within normal limits, no longer drowsy, with intact speech, attention and concentration. However, mother feels that patient is still not yet completely back to baseline.   - Labs: ALT elevated, but AST and T. Bili were normal. Ca and Na are normal. Glucose was mildly elevated at 117 on admission, normal today at 87. BUN and Cr normal. WBC continues to be normal. UDS negative. ESR elevated at 34. CRP normal at 0.6. UDS negative on 2/4. Influenza, RSV and Covid negative. HIV negative. Pregnancy test negative. Lupus panel negative. RPR negative. TSH normal. B12 normal.  - CT head was normal.   - MRI brain w/wo contrast: Intermittently motion degraded examination. Within this limitation, there is an unremarkable MRI appearance of the brain. No evidence of an acute intracranial abnormality. Paranasal sinus disease. - MRI cervical spine: Significantly motion degraded examination. Within this limitation, no lesion is identified within the cervical spinal cord. Mild cervical spondylosis as described. Most notably at C5-C6, a central disc protrusion mildly effaces the ventral thecal sac.  - LTM EEG report for Wednesday AM: Intermittent slow, generalized. This study is suggestive of mild diffuse encephalopathy. No seizures or epileptiform discharges were seen throughout the recording. LTM EEG was discontinued on Wednesday. - Will need to obtain an LP to assess for possible autoimmune encephalopathy. This will need to be under fluoroscopic  guidance due to morbid obesity and mild agitation. Initial attempt was unsuccessful.   - DDx: - DDx for event at home includes subclinical seizure followed by postictal state, viral encephalitis, autoimmune encephalitis or toxidrome.  - During her inpatient stay, vitals have been stable except for mild intermittent hypotension and intermittently elevated HR between 101-109 and labs have been unremarkable decreasing likelihood for electrolyte disturbances, infectious causes, endocrine disorders, and toxicities. Elevated ESR does somewhat increase the probability of an  autoimmune etiology. EEG was not consistent with seizure and thus is suggestive of nonepileptic etiology. Patient did have some risk factors for CNS infection including preceding respiratory infection and spinal pain prior to admission but continues to be afebrile with no meningismus on exam. - Overall impression: Encephalopathy of unknown etiology, suspect possible new-onset subclinical seizure activity versus autoimmune cause (DDx would include Hashimoto's thyroiditis, CNS lupus, neurosarcoidosis and anti-NMDA receptor encephalitis)   Recommendations  - Antithyroglobulin antibody level pending - Serum anti-NMDA receptor antibody level pending - Serum paraneoplastic panel pending - Will need to repeat attempt at LP today now that she is more well-hydrated. CSF labs ordered: Protein, glucose, cell count, bacterial and fungal cultures, gram stain, OCBs, VDRL, cytology, acid fast bacillus and TB nucleic acid amplification test, IgG index, anti-NMDA receptor antibody titer and CSF meningitis/encephalitis PCR panel (all ordered)    ______________________________________________________________________   Bonney SHARK, Tasia Liz, MD [facility]

## 2023-11-26 NOTE — Progress Notes (Signed)
 PROGRESS NOTE   Deborah Cooper  FMW:[id number]    DOB: [birth year]    DOA: 11/23/2023  PCP: Patient, No Pcp Per   I have briefly reviewed patients previous medical records in [facility].  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  Admitted for acute encephalopathy of unclear etiology.  Extensive workup thus far negative for cause.  Neurology consulting.  Now significantly improved.  Neurology pursuing LP under fluoroscopy by IR today, supposed to be at 2 PM.   Assessment & Plan:  Principal Problem:   Acute encephalopathy   Acute encephalopathy, unclear etiology History as noted above CT head 2/4 without acute findings. MRI head without and with contrast: Intermittently motion degraded.  Unremarkable MRI appearance of the brain.  No acute intracranial abnormality. MRI C-spine: Significantly motion degraded.  No lesion identified within the cervical spinal cord. LTM VIDEO EEG: Suggestive of mild diffuse encephalopathy without seizures or epileptiform discharges Extensive workup as follows unremarkable: CRP, vitamin B12, ammonia level, VBG without CO2 retention, no leukocytosis, negative serum acetaminophen  and salicylate level.  Negative blood alcohol level.  UDS negative.  Urine pregnancy and HIV screen negative.  RPR nonreactive.  TSH 1.767.  No leukocytosis.  Respiratory panel by PCR: Negative. Workup still in progress: NMDA, anti-Jo antibody, ENA. Neurology initial consultation note appreciated.  Felt that suspected possible these included new onset subclinical seizure activity versus autoimmune causes.?   Postviral aseptic meningitis/encephalitis Unsuccessful fluoroscopic guided LP by IR yesterday.  Neurology pursuing LP under fluoroscopy by IR today, supposed to be at 2 PM.. Mental status has significant really improved, normal but probably not at her sharp baseline as per parents. Since admission, has mostly remained n.p.o. due to initial mental status changes secondary to encephalopathic process, +/- doses of Ativan  given for imaging or procedures and then in preparation for LP.  2/6 had hypoglycemic range CBGs and therefore started on IV D5 infusion.  All of this has been regularly updated with patient and parents at bedside.  Resume diet post LP.  RN advised to let us  know when the procedure is completed.  In the interim continue IVF. Mother concerned about high blood sugar of 109 yesterday.  Advised her that this is minimal, nonconcerning but advised that we will check her A1c to rule out DM or pre-DM. A1C: 5.5  Hypokalemia Replaced.  Magnesium 2.  Hypoxia without respiratory failure O2 saturation of 89% on 2/6 evening and placed on 2 L/min [place] oxygen. Wean off oxygen as tolerated.  Desat protocol prior to discharge. Agreed with mother that it would be appropriate for patient to get sleep study as outpatient.  She does have history of snoring but no history of apneic spells.  She has body habitus concerning for sleep apnea.  Current hypoxia may be due to possible underlying sleep apnea worsened by her encephalopathic process.  There is no height or weight on file to calculate BMI.   DVT prophylaxis: SCDs Start: 11/23/23 1706     Code Status: Full Code:  Family Communication: Father at bedside and mother via father's speaker phone.  Discussed in detail with patient and both parents  along with Dr. Lindzen who was present during the entire time. Disposition:  Ongoing evaluation for acute encephalopathy of undetermined etiology.  LP pending.  Remains on IV fluids while NPO.  Pending review  of LP results and clearance by neurology, possible DC home tomorrow.     Consultants:   Neurology  Procedures:   LTM EEG  Antimicrobials:   None   Subjective:  Patient seen along with Dr. Lindzen and patient's father at bedside.  Patient's mother was on dialysis speaker phone.  Patient denied any complaints.  No headache.  Reportedly tolerated some diet last night.  Objective:   Vitals:   11/25/23 2349 11/26/23 0400 11/26/23 0735 11/26/23 1143  BP: 92/61 93/62 105/65 109/71  Pulse: 91 92 88 90  Resp: 17 16 18 17   Temp: 98.2 F (36.8 C) 98.1 F (36.7 C) 97.9 F (36.6 C) 98.4 F (36.9 C)  TempSrc: Oral Oral Oral Oral  SpO2: 99% 99% 95% 97%  Height:        General exam: Young female, moderately built and obese, lying comfortably propped up in bed without distress.  Oral mucosa moist.  Looks much improved over the last 2 days. Respiratory system: Clear to auscultation.  No increased work of breathing Cardiovascular system: S1 & S2 heard, RRR. No JVD, murmurs, rubs, gallops or clicks. No pedal edema.  Off of telemetry. Gastrointestinal system: Abdomen is nondistended, soft and nontender. No organomegaly or masses felt. Normal bowel sounds heard. Central nervous system: Alert and oriented.  Extensive neurological evaluation by Dr. Merrianne without any deficits. Extremities: Symmetric 5 x 5 power. Skin: No rashes, lesions or ulcers Psychiatry: Judgement and insight improved. Mood & affect pleasant and appropriate.    Data Reviewed:   I have personally reviewed following labs and imaging studies   CBC: Recent Labs  Lab 11/23/23 1006 11/23/23 1047 11/24/23 1443  WBC 7.3  --  5.5  HGB 12.5 13.3 12.4  HCT 40.6 39.0 39.5  MCV 82.9  --  80.9  PLT 277  --  274    Basic Metabolic Panel: Recent Labs  Lab 11/23/23 1006 11/23/23 1047 11/24/23 1443 11/25/23 0630 11/25/23 2210 11/26/23 0639  NA 138 141 139 138  --  137  K 3.6 3.7 3.3* 3.4* 3.7 3.5  CL 105  --  106  109  --  108  CO2 23  --  23 22  --  22  GLUCOSE 117*  --  87 85  --  107*  BUN 8  --  9 8  --  <5*  CREATININE 0.70  --  0.79 0.82  --  0.66  CALCIUM 8.9  --  8.7* 8.4*  --  8.6*  MG  --   --   --  2.0  --   --     Liver Function Tests: Recent Labs  Lab 11/23/23 1006 11/24/23 1443  AST 37 25  ALT 52* 36  ALKPHOS 57 57  BILITOT 0.4 1.1  PROT 7.3 7.3  ALBUMIN 3.6 3.5    CBG: Recent Labs  Lab 11/26/23 0004 11/26/23 0612 11/26/23 1144  GLUCAP 100* 97 108*    Microbiology Studies:   Recent Results (from the past 240 hours)  Resp panel by RT-PCR (RSV, Flu A&B, Covid) Anterior Nasal Swab     Status: None   Collection Time: 11/23/23 10:07 AM   Specimen: Anterior Nasal Swab  Result Value Ref Range Status   SARS Coronavirus 2 by RT PCR NEGATIVE  NEGATIVE Final   Influenza A by PCR NEGATIVE NEGATIVE Final   Influenza B by PCR NEGATIVE NEGATIVE Final    Comment: (NOTE) The Xpert Xpress SARS-CoV-2/FLU/RSV plus assay is intended as an aid in the diagnosis of influenza from Nasopharyngeal swab specimens and should not be used as a sole basis for treatment. Nasal washings and aspirates are unacceptable for Xpert Xpress SARS-CoV-2/FLU/RSV testing.  Fact Sheet for Patients: bloggercourse.com  Fact Sheet for Healthcare Providers: seriousbroker.it  This test is not yet approved or cleared by the [place]  FDA and has been authorized for detection and/or diagnosis of SARS-CoV-2 by FDA under an Emergency Use Authorization (EUA). This EUA will remain in effect (meaning this test can be used) for the duration of the COVID-19 declaration under Section 564(b)(1) of the Act, 21 U.S.C. section 360bbb-3(b)(1), unless the authorization is terminated or revoked.     Resp Syncytial Virus by PCR NEGATIVE NEGATIVE Final    Comment: (NOTE) Fact Sheet for Patients: bloggercourse.com  Fact Sheet for Healthcare  Providers: seriousbroker.it  This test is not yet approved or cleared by the [place]  FDA and has been authorized for detection and/or diagnosis of SARS-CoV-2 by FDA under an Emergency Use Authorization (EUA). This EUA will remain in effect (meaning this test can be used) for the duration of the COVID-19 declaration under Section 564(b)(1) of the Act, 21 U.S.C. section 360bbb-3(b)(1), unless the authorization is terminated or revoked.  Performed at [facility], [address], [place], [place] [address]   Respiratory (~20 pathogens) panel by PCR     Status: None   Collection Time: 11/26/23  5:35 AM   Specimen: Nasopharyngeal Swab; Respiratory  Result Value Ref Range Status   Adenovirus NOT DETECTED NOT DETECTED Final   Coronavirus 229E NOT DETECTED NOT DETECTED Final    Comment: (NOTE) The Coronavirus on the Respiratory Panel, DOES NOT test for the novel  Coronavirus (2019 nCoV)    Coronavirus HKU1 NOT DETECTED NOT DETECTED Final   Coronavirus NL63 NOT DETECTED NOT DETECTED Final   Coronavirus OC43 NOT DETECTED NOT DETECTED Final   Metapneumovirus NOT DETECTED NOT DETECTED Final   Rhinovirus / Enterovirus NOT DETECTED NOT DETECTED Final   Influenza A NOT DETECTED NOT DETECTED Final   Influenza B NOT DETECTED NOT DETECTED Final   Parainfluenza Virus 1 NOT DETECTED NOT DETECTED Final   Parainfluenza Virus 2 NOT DETECTED NOT DETECTED Final   Parainfluenza Virus 3 NOT DETECTED NOT DETECTED Final   Parainfluenza Virus 4 NOT DETECTED NOT DETECTED Final   Respiratory Syncytial Virus NOT DETECTED NOT DETECTED Final   Bordetella pertussis NOT DETECTED NOT DETECTED Final   Bordetella Parapertussis NOT DETECTED NOT DETECTED Final   Chlamydophila pneumoniae NOT DETECTED NOT DETECTED Final   Mycoplasma pneumoniae NOT DETECTED NOT DETECTED Final    Comment: Performed at [facility], [address], [place], [place] [address]    Radiology  Studies:  MR BRAIN W WO CONTRAST Result Date: 11/25/2023 CLINICAL DATA:  Provided history: Meningitis/CNS infection suspected. Possible autoimmune encephalitis. Demyelinating disease. EXAM: MRI HEAD WITHOUT AND WITH CONTRAST MRI CERVICAL SPINE WITHOUT AND WITH CONTRAST TECHNIQUE: Multiplanar, multiecho pulse sequences of the brain and surrounding structures, and cervical spine, to include the craniocervical junction and cervicothoracic junction, were obtained without and with intravenous contrast. CONTRAST:  9mL GADAVIST  GADOBUTROL  1 MMOL/ML IV SOLN COMPARISON:  Head CT 11/23/2023. FINDINGS: MRI HEAD FINDINGS Intermittently motion degraded examination (with up to moderate motion degradation of the  acquired sequences). Within this limitation, findings are as follows. Brain: Cerebral volume is normal. No cortical encephalomalacia is identified. No significant cerebral white matter disease. There is no acute infarct. No evidence of an intracranial mass. No chronic intracranial blood products. No extra-axial fluid collection. No midline shift. No pathologic intracranial enhancement identified. Vascular: Maintained flow voids within the proximal large arterial vessels. Small developmental venous anomaly within the right cerebellar hemisphere (anatomic variant). Skull and upper cervical spine: No focal worrisome marrow lesion. Sinuses/Orbits: No mass or acute finding within the imaged orbits. 15 mm mucous retention cyst within the right maxillary sinus. Small mucous retention cyst within the left maxillary sinus. Mild mucosal thickening within the bilateral sphenoid, bilateral ethmoid and bilateral frontal sinuses. MRI CERVICAL SPINE FINDINGS Intermittently motion degraded examination. Most notably, the axial T2 FRFSE sequence is moderate-to-severely motion degraded. Within this limitation, findings are as follows. Alignment: No significant spondylolisthesis. Vertebrae: Cervical vertebral body height is maintained. No  significant marrow edema or focal worrisome marrow lesion. Cord: Within limitations of significant motion degradation, no signal abnormality is identified within the cervical spinal cord. No pathologic spinal cord enhancement. Posterior Fossa, vertebral arteries, paraspinal tissues: Posterior fossa assessed on concurrently performed brain MRI. Flow voids preserved within the imaged cervical vertebral arteries. No paraspinal mass or collection. Disc levels: No more than mild disc degeneration within the cervical spine. C2-C3: No significant disc herniation or stenosis. C3-C4: No significant disc herniation or stenosis. C4-C5: A small central disc protrusion mildly effaces the ventral thecal sac. No significant foraminal stenosis. C5-C6: No significant disc herniation or stenosis. C6-C7: Slight disc bulge. No significant spinal canal or foraminal stenosis. C7-T1:  No significant disc herniation or stenosis. IMPRESSION: MRI brain: 1. Intermittently motion degraded examination. Within this limitation, there is an unremarkable MRI appearance of the brain. No evidence of an acute intracranial abnormality. 2. Paranasal sinus disease as described. MRI cervical spine: 1. Significantly motion degraded examination. Within this limitation, no lesion is identified within the cervical spinal cord. 2. Mild cervical spondylosis as described. Most notably at C5-C6, a central disc protrusion mildly effaces the ventral thecal sac. Electronically Signed   By: Rockey Childs D.O.   On: 11/25/2023 13:56   MR CERVICAL SPINE W WO CONTRAST Result Date: 11/25/2023 CLINICAL DATA:  Provided history: Meningitis/CNS infection suspected. Possible autoimmune encephalitis. Demyelinating disease. EXAM: MRI HEAD WITHOUT AND WITH CONTRAST MRI CERVICAL SPINE WITHOUT AND WITH CONTRAST TECHNIQUE: Multiplanar, multiecho pulse sequences of the brain and surrounding structures, and cervical spine, to include the craniocervical junction and cervicothoracic  junction, were obtained without and with intravenous contrast. CONTRAST:  9mL GADAVIST  GADOBUTROL  1 MMOL/ML IV SOLN COMPARISON:  Head CT 11/23/2023. FINDINGS: MRI HEAD FINDINGS Intermittently motion degraded examination (with up to moderate motion degradation of the acquired sequences). Within this limitation, findings are as follows. Brain: Cerebral volume is normal. No cortical encephalomalacia is identified. No significant cerebral white matter disease. There is no acute infarct. No evidence of an intracranial mass. No chronic intracranial blood products. No extra-axial fluid collection. No midline shift. No pathologic intracranial enhancement identified. Vascular: Maintained flow voids within the proximal large arterial vessels. Small developmental venous anomaly within the right cerebellar hemisphere (anatomic variant). Skull and upper cervical spine: No focal worrisome marrow lesion. Sinuses/Orbits: No mass or acute finding within the imaged orbits. 15 mm mucous retention cyst within the right maxillary sinus. Small mucous retention cyst within the left maxillary sinus. Mild mucosal thickening within the bilateral sphenoid, bilateral ethmoid and bilateral  frontal sinuses. MRI CERVICAL SPINE FINDINGS Intermittently motion degraded examination. Most notably, the axial T2 FRFSE sequence is moderate-to-severely motion degraded. Within this limitation, findings are as follows. Alignment: No significant spondylolisthesis. Vertebrae: Cervical vertebral body height is maintained. No significant marrow edema or focal worrisome marrow lesion. Cord: Within limitations of significant motion degradation, no signal abnormality is identified within the cervical spinal cord. No pathologic spinal cord enhancement. Posterior Fossa, vertebral arteries, paraspinal tissues: Posterior fossa assessed on concurrently performed brain MRI. Flow voids preserved within the imaged cervical vertebral arteries. No paraspinal mass or  collection. Disc levels: No more than mild disc degeneration within the cervical spine. C2-C3: No significant disc herniation or stenosis. C3-C4: No significant disc herniation or stenosis. C4-C5: A small central disc protrusion mildly effaces the ventral thecal sac. No significant foraminal stenosis. C5-C6: No significant disc herniation or stenosis. C6-C7: Slight disc bulge. No significant spinal canal or foraminal stenosis. C7-T1:  No significant disc herniation or stenosis. IMPRESSION: MRI brain: 1. Intermittently motion degraded examination. Within this limitation, there is an unremarkable MRI appearance of the brain. No evidence of an acute intracranial abnormality. 2. Paranasal sinus disease as described. MRI cervical spine: 1. Significantly motion degraded examination. Within this limitation, no lesion is identified within the cervical spinal cord. 2. Mild cervical spondylosis as described. Most notably at C5-C6, a central disc protrusion mildly effaces the ventral thecal sac. Electronically Signed   By: Rockey Childs D.O.   On: 11/25/2023 13:56   DG FL GUIDED LUMBAR PUNCTURE Result Date: 11/24/2023 CLINICAL DATA:  33 year old admitted with acute encephalopathy. Requested for lumbar puncture for rule out meningitis EXAM: DIAGNOSTIC LUMBAR PUNCTURE UNDER FLUOROSCOPIC GUIDANCE COMPARISON:  None Available. FLUOROSCOPY: Radiation Exposure Index (as provided by the fluoroscopic device): 37.5 mGy Kerma PROCEDURE: Informed consent was obtained from the patient prior to the procedure, including potential complications of headache, allergy, and pain. With the patient prone, the lower back was prepped with Betadine. 1% Lidocaine  was used for local anesthesia. Lumbar puncture was performed at the L3-L4 and L4-L5 level using a 20 gauge needle with unsuccessful return of CSF. Charles Dellrose and Jamie Covington both attempted multiple locations. The needle was felt in the thecal sac on multiple occasions, without return  of CSF. The patient tolerated the procedure poorly, and was unable to lay still, requiring complete repositioning of needle on 2 occasions. IMPRESSION: Unsuccessful attempt at lumbar puncture, despite multiple attempts by 2 providers. Patient did not tolerate the procedure well. Procedure performed by Carlin Griffon, PA-C Electronically Signed   By: Ester Sides M.D.   On: 11/24/2023 16:05    Scheduled Meds:    lidocaine  (PF)  10 mL Other Once   sodium chloride  flush  3 mL Intravenous Q12H    Continuous Infusions:    sodium chloride  75 mL/hr at 11/26/23 0014   dextrose  100 mL/hr at 11/26/23 0014     LOS: 2 days     Trenda Mar, MD,  FACP, [facility], [facility], [facility]   [facility] [place]      To contact the attending provider between 7A-7P or the covering provider during after hours 7P-7A, please log into the web site [email or web address] and access using universal [place] password for that web site. If you do not have the password, please call the hospital operator.  11/26/2023, 1:36 PM

## 2023-11-26 NOTE — Plan of Care (Signed)

## 2023-11-27 DIAGNOSIS — G030 Nonpyogenic meningitis: Secondary | ICD-10-CM | POA: Diagnosis not present

## 2023-11-27 DIAGNOSIS — G9340 Encephalopathy, unspecified: Secondary | ICD-10-CM | POA: Diagnosis not present

## 2023-11-27 LAB — GLUCOSE, CAPILLARY
Glucose-Capillary: 144 mg/dL — ABNORMAL HIGH (ref 70–99)
Glucose-Capillary: 111 mg/dL — ABNORMAL HIGH (ref 70–99)

## 2023-11-27 NOTE — Discharge Summary (Signed)
 Physician Discharge Summary  Deborah Cooper FMW:[id number] DOB: 05-Oct-1991  PCP: Patient, No Pcp Per  Admitted from: Home Discharged to: Home  Admit date: 11/23/2023 Discharge date: 11/27/2023  Recommendations for Outpatient Follow-up:    Follow-up Information     Benjamine Aland, MD Follow up on 12/03/2023.   Specialty: Family Medicine Why: Your appointment is at 2pm. Please arrive 30 min early and bring a picture ID, insurance card and current medications. You will need to wear  a mask at your appointment.  MD to follow outstanding labs sent from the hospital including spinal fluid results and blood test results.  Consider outpatient sleep study. Contact information: [address] [place] [address] [phone]         [facility]. Call.   Specialty: Pulmonology Why: Follow-up regarding evaluation of sleep apnea. Contact information: [address] [employer] [address] [place]  [address] [phone]                 Home Health: None    Equipment/Devices: None    Discharge Condition: Improved and stable.   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  Admitted for acute encephalopathy.  Neurology was consulted.  She underwent extensive evaluation including MRI brain and finally lumbar puncture under fluoroscopic guidance.  Mental status  gradually improved over several days and is now back to her baseline/normal.  Neurology feels that her overall presentation is possibly from aseptic meningitis.  Several of her send out lab work and CSF studies are pending and must be followed up by PCP as outpatient.     Assessment & Plan:    Acute encephalopathy, suspected due to aseptic meningitis History as noted above CT head 2/4 without acute findings. MRI head without and with contrast: Intermittently motion degraded.  Unremarkable MRI appearance of the brain.  No acute intracranial abnormality. MRI C-spine: Significantly motion degraded.  No lesion identified within the cervical spinal cord. LTM VIDEO EEG: Suggestive of mild diffuse encephalopathy without seizures or epileptiform discharges Extensive workup as follows unremarkable: CRP, vitamin B12, ammonia level, VBG without CO2 retention, no leukocytosis, negative serum acetaminophen  and salicylate level.  Negative blood alcohol level.  UDS negative.  Urine pregnancy and HIV screen negative.  RPR nonreactive.  TSH 1.767.  No leukocytosis.  Respiratory panel by PCR: Negative.  NMDA receptor IgG: Negative.  Cryptococcal antigen negative.  Meningitis/encephalitis panel: Negative Outstanding labs that need to be followed as outpatient: NMDA receptor IgG (?  CSF), paraneoplastic antibodies, thyroglobulin antibody, thyroglobulin Neurology initial consultation note appreciated and had following differential diagnosis: New onset subclinical seizure activity versus autoimmune causes.?  Postviral aseptic meningitis/encephalitis It took multiple attempts to get her MRI brain and LP due to mental status changes and inability to cooperate for the studies and may be dehydration as far  as LP is concerned. Underwent LP under fluoroscopy by IR on 2/7.  Reviewed CSF results with Dr. Lindzen and patient in the room.  He indicates that the findings are suggestive of aseptic meningitis.   CSF: Clear, glucose  62, RBC count 34 and 21 (tube 1 and 4), WBC 19 and 29 (tube 1 and 4), 0 neutrophils, 99 and 98 lymphocytes, 1 and 2 monocytes and total protein 42.  No organisms seen on CSF Gram stain. Pending CSF results that must be followed as outpatient: Bacterial and fungal culture, AFB culture, pathology smear, IgG-CSF index, oligoclonal bands, VDRL CSF Gram stain negative, cultures pending. Mild hyperglycemia likely related to stress response.  A1c 5.5. Over the course of several days, mental status has gradually improved and she is currently back to her normal/baseline. Neurology has cleared her for discharge home and there are no restrictions at discharge   Hypokalemia Replaced.  Magnesium 2.   Hypoxia without respiratory failure O2 saturation of 89% on 2/6 evening and placed on 2 L/min [place] oxygen. Agreed with mother that it would be appropriate for patient to get sleep study as outpatient.  She does have history of snoring but no history of apneic spells.  She has body habitus concerning for sleep apnea.  Current hypoxia may be due to possible underlying sleep apnea worsened by her encephalopathic process. Resolved.   There is no height or weight on file to calculate BMI.     Consultants:   Neurology Interventional radiology   Procedures:   LTM EEG LP x 2 under fluoroscopy guidance by IR   Discharge Instructions  Discharge Instructions     Activity as tolerated - No restrictions   Complete by: As directed    Call MD for:   Complete by: As directed    Recurrent mental status changes.   Call MD for:  difficulty breathing, headache or visual disturbances   Complete by: As directed    Call MD for:  extreme fatigue   Complete by: As directed    Call MD for:  persistant dizziness or light-headedness   Complete by: As directed    Call MD for:  persistant nausea and vomiting   Complete by: As directed    Call MD for:  severe uncontrolled pain   Complete by: As directed    Call MD for:   temperature >100.4   Complete by: As directed    Diet general   Complete by: As directed    Increase activity slowly   Complete by: As directed         Medication List    You have not been prescribed any medications.    Allergies  Allergen Reactions   Penicillins       Procedures/Studies: DG FL GUIDED LUMBAR PUNCTURE Result Date: 11/26/2023 CLINICAL DATA:  33 year old female with acute onset encephalopathy. Request for lumbar puncture. Previously failed lumbar puncture attempt 2 days ago. EXAM: LUMBAR PUNCTURE UNDER FLUOROSCOPY PROCEDURE: An appropriate skin entry site was determined fluoroscopically. Operator donned sterile gloves and mask. Skin site was marked, then prepped with Betadine, draped in usual sterile fashion, and infiltrated locally with 1% lidocaine . A 6 inch 20 gauge spinal needle was advanced into the thecal sac at L3-L4 from a left interlaminar approach. Clear colorless CSF spontaneously returned. 22 ml CSF were collected and divided among 4 sterile vials for the requested laboratory studies. The needle was then removed. The patient tolerated the procedure well and there were no complications.  FLUOROSCOPY: Radiation Exposure Index (as provided by the fluoroscopic device): 23.1 mGy Kerma IMPRESSION: Technically successful lumbar puncture under fluoroscopy. This exam was performed by Carlin Griffon, PA-C, and was supervised and interpreted by Dr. Elsie Shoulder. Electronically Signed   By: Elsie ONEIDA Shoulder M.D.   On: 11/26/2023 15:09   MR BRAIN W WO CONTRAST Result Date: 11/25/2023 CLINICAL DATA:  Provided history: Meningitis/CNS infection suspected. Possible autoimmune encephalitis. Demyelinating disease. EXAM: MRI HEAD WITHOUT AND WITH CONTRAST MRI CERVICAL SPINE WITHOUT AND WITH CONTRAST TECHNIQUE: Multiplanar, multiecho pulse sequences of the brain and surrounding structures, and cervical spine, to include the craniocervical junction and cervicothoracic junction, were  obtained without and with intravenous contrast. CONTRAST:  9mL GADAVIST  GADOBUTROL  1 MMOL/ML IV SOLN COMPARISON:  Head CT 11/23/2023. FINDINGS: MRI HEAD FINDINGS Intermittently motion degraded examination (with up to moderate motion degradation of the acquired sequences). Within this limitation, findings are as follows. Brain: Cerebral volume is normal. No cortical encephalomalacia is identified. No significant cerebral white matter disease. There is no acute infarct. No evidence of an intracranial mass. No chronic intracranial blood products. No extra-axial fluid collection. No midline shift. No pathologic intracranial enhancement identified. Vascular: Maintained flow voids within the proximal large arterial vessels. Small developmental venous anomaly within the right cerebellar hemisphere (anatomic variant). Skull and upper cervical spine: No focal worrisome marrow lesion. Sinuses/Orbits: No mass or acute finding within the imaged orbits. 15 mm mucous retention cyst within the right maxillary sinus. Small mucous retention cyst within the left maxillary sinus. Mild mucosal thickening within the bilateral sphenoid, bilateral ethmoid and bilateral frontal sinuses. MRI CERVICAL SPINE FINDINGS Intermittently motion degraded examination. Most notably, the axial T2 FRFSE sequence is moderate-to-severely motion degraded. Within this limitation, findings are as follows. Alignment: No significant spondylolisthesis. Vertebrae: Cervical vertebral body height is maintained. No significant marrow edema or focal worrisome marrow lesion. Cord: Within limitations of significant motion degradation, no signal abnormality is identified within the cervical spinal cord. No pathologic spinal cord enhancement. Posterior Fossa, vertebral arteries, paraspinal tissues: Posterior fossa assessed on concurrently performed brain MRI. Flow voids preserved within the imaged cervical vertebral arteries. No paraspinal mass or collection. Disc  levels: No more than mild disc degeneration within the cervical spine. C2-C3: No significant disc herniation or stenosis. C3-C4: No significant disc herniation or stenosis. C4-C5: A small central disc protrusion mildly effaces the ventral thecal sac. No significant foraminal stenosis. C5-C6: No significant disc herniation or stenosis. C6-C7: Slight disc bulge. No significant spinal canal or foraminal stenosis. C7-T1:  No significant disc herniation or stenosis. IMPRESSION: MRI brain: 1. Intermittently motion degraded examination. Within this limitation, there is an unremarkable MRI appearance of the brain. No evidence of an acute intracranial abnormality. 2. Paranasal sinus disease as described. MRI cervical spine: 1. Significantly motion degraded examination. Within this limitation, no lesion is identified within the cervical spinal cord. 2. Mild cervical spondylosis as described. Most notably at C5-C6, a central disc protrusion mildly effaces the ventral thecal sac. Electronically Signed   By: Rockey Childs D.O.   On: 11/25/2023 13:56   MR CERVICAL SPINE W WO CONTRAST Result Date: 11/25/2023 CLINICAL DATA:  Provided history: Meningitis/CNS infection suspected. Possible autoimmune encephalitis. Demyelinating disease. EXAM: MRI HEAD WITHOUT AND WITH CONTRAST MRI CERVICAL SPINE WITHOUT AND WITH CONTRAST TECHNIQUE: Multiplanar, multiecho pulse sequences of the brain and surrounding structures, and cervical spine, to include the craniocervical junction and cervicothoracic junction, were obtained without and with intravenous contrast. CONTRAST:  9mL GADAVIST  GADOBUTROL  1 MMOL/ML  IV SOLN COMPARISON:  Head CT 11/23/2023. FINDINGS: MRI HEAD FINDINGS Intermittently motion degraded examination (with up to moderate motion degradation of the acquired sequences). Within this limitation, findings are as follows. Brain: Cerebral volume is normal. No cortical encephalomalacia is identified. No significant cerebral white matter  disease. There is no acute infarct. No evidence of an intracranial mass. No chronic intracranial blood products. No extra-axial fluid collection. No midline shift. No pathologic intracranial enhancement identified. Vascular: Maintained flow voids within the proximal large arterial vessels. Small developmental venous anomaly within the right cerebellar hemisphere (anatomic variant). Skull and upper cervical spine: No focal worrisome marrow lesion. Sinuses/Orbits: No mass or acute finding within the imaged orbits. 15 mm mucous retention cyst within the right maxillary sinus. Small mucous retention cyst within the left maxillary sinus. Mild mucosal thickening within the bilateral sphenoid, bilateral ethmoid and bilateral frontal sinuses. MRI CERVICAL SPINE FINDINGS Intermittently motion degraded examination. Most notably, the axial T2 FRFSE sequence is moderate-to-severely motion degraded. Within this limitation, findings are as follows. Alignment: No significant spondylolisthesis. Vertebrae: Cervical vertebral body height is maintained. No significant marrow edema or focal worrisome marrow lesion. Cord: Within limitations of significant motion degradation, no signal abnormality is identified within the cervical spinal cord. No pathologic spinal cord enhancement. Posterior Fossa, vertebral arteries, paraspinal tissues: Posterior fossa assessed on concurrently performed brain MRI. Flow voids preserved within the imaged cervical vertebral arteries. No paraspinal mass or collection. Disc levels: No more than mild disc degeneration within the cervical spine. C2-C3: No significant disc herniation or stenosis. C3-C4: No significant disc herniation or stenosis. C4-C5: A small central disc protrusion mildly effaces the ventral thecal sac. No significant foraminal stenosis. C5-C6: No significant disc herniation or stenosis. C6-C7: Slight disc bulge. No significant spinal canal or foraminal stenosis. C7-T1:  No significant disc  herniation or stenosis. IMPRESSION: MRI brain: 1. Intermittently motion degraded examination. Within this limitation, there is an unremarkable MRI appearance of the brain. No evidence of an acute intracranial abnormality. 2. Paranasal sinus disease as described. MRI cervical spine: 1. Significantly motion degraded examination. Within this limitation, no lesion is identified within the cervical spinal cord. 2. Mild cervical spondylosis as described. Most notably at C5-C6, a central disc protrusion mildly effaces the ventral thecal sac. Electronically Signed   By: Rockey Childs D.O.   On: 11/25/2023 13:56   DG FL GUIDED LUMBAR PUNCTURE Result Date: 11/24/2023 CLINICAL DATA:  33 year old admitted with acute encephalopathy. Requested for lumbar puncture for rule out meningitis EXAM: DIAGNOSTIC LUMBAR PUNCTURE UNDER FLUOROSCOPIC GUIDANCE COMPARISON:  None Available. FLUOROSCOPY: Radiation Exposure Index (as provided by the fluoroscopic device): 37.5 mGy Kerma PROCEDURE: Informed consent was obtained from the patient prior to the procedure, including potential complications of headache, allergy, and pain. With the patient prone, the lower back was prepped with Betadine. 1% Lidocaine  was used for local anesthesia. Lumbar puncture was performed at the L3-L4 and L4-L5 level using a 20 gauge needle with unsuccessful return of CSF. Charles [place] and Jamie Covington both attempted multiple locations. The needle was felt in the thecal sac on multiple occasions, without return of CSF. The patient tolerated the procedure poorly, and was unable to lay still, requiring complete repositioning of needle on 2 occasions. IMPRESSION: Unsuccessful attempt at lumbar puncture, despite multiple attempts by 2 providers. Patient did not tolerate the procedure well. Procedure performed by Carlin Griffon, PA-C Electronically Signed   By: Ester Sides M.D.   On: 11/24/2023 16:05   Overnight EEG with video Result  Date: 11/24/2023 Shelton Arlin KIDD, MD     11/25/2023 12:10 PM Patient Name: Deborah Cooper MRN: [id number] Epilepsy Attending: Arlin KIDD Shelton Referring Physician/Provider: Merrianne Locus, MD  Duration: 11/23/2023 2321 to 11/24/2023 1303  Patient history: 32yo F with ams. EEG to evaluate for seizure  Level of alertness: Awake, asleep  AEDs during EEG study: None  Technical aspects: This EEG study was done with scalp electrodes positioned according to the 10-20 International system of electrode placement. Electrical activity was reviewed with band pass filter of 1-70Hz , sensitivity of 7 uV/mm, display speed of 39mm/sec with a 60Hz  notched filter applied as appropriate. EEG data were recorded continuously and digitally stored.  Video monitoring was available and reviewed as appropriate.  Description: The posterior dominant rhythm consists of 8 Hz activity of moderate voltage (25-35 uV) seen predominantly in posterior head regions, symmetric and reactive to eye opening and eye closing. Sleep was characterized by vertex waves, sleep spindles (12 to 14 Hz), maximal frontocentral region. EEG showed intermittent generalized predominantly 5 to 7 Hz theta slowing admixed with intermittent 2-3hz  delta slowing. Hyperventilation and photic stimulation were not performed.    ABNORMALITY - Intermittent slow, generalized  IMPRESSION: This study is suggestive of mild diffuse encephalopathy. No seizures or epileptiform discharges were seen throughout the recording.  Arlin KIDD Shelton    EEG adult Result Date: 11/23/2023 Shelton Arlin KIDD, MD     11/23/2023  6:20 PM Patient Name: Deborah Cooper MRN: [id number] Epilepsy Attending: Arlin KIDD Shelton Referring Physician/Provider: Merrianne Locus, MD Date: 11/22/2022 Duration: 23.37 mins Patient history: 33yo F with ams. EEG to evaluate for seizure Level of alertness: Awake AEDs during EEG study: None Technical aspects: This EEG study was done with scalp electrodes positioned according to the 10-20 International system of  electrode placement. Electrical activity was reviewed with band pass filter of 1-70Hz , sensitivity of 7 uV/mm, display speed of 61mm/sec with a 60Hz  notched filter applied as appropriate. EEG data were recorded continuously and digitally stored.  Video monitoring was available and reviewed as appropriate. Description: The posterior dominant rhythm consists of 8 Hz activity of moderate voltage (25-35 uV) seen predominantly in posterior head regions, symmetric and reactive to eye opening and eye closing. EEG showed continuous generalized predominantly 5 to 7 Hz theta slowing admixed with intermittent 2-3hz  delta slowing. Hyperventilation and photic stimulation were not performed.   ABNORMALITY - Continuous slow, generalized IMPRESSION: This study is suggestive of mild to moderate diffuse encephalopathy. No seizures or epileptiform discharges were seen throughout the recording. Priyanka KIDD Shelton   CT HEAD WO CONTRAST ( ) Result Date: 11/23/2023 CLINICAL DATA:  Altered mental status EXAM: CT HEAD WITHOUT CONTRAST TECHNIQUE: Contiguous axial images were obtained from the base of the skull through the vertex without intravenous contrast. RADIATION DOSE REDUCTION: This exam was performed according to the departmental dose-optimization program which includes automated exposure control, adjustment of the mA and/or kV according to patient size and/or use of iterative reconstruction technique. COMPARISON:  None Available. FINDINGS: Brain: No evidence of acute infarction, hemorrhage, mass, mass effect, or midline shift. No hydrocephalus or extra-axial fluid collection. Gray-white differentiation is preserved. Basal cisterns are patent. Vascular: No hyperdense vessel. Skull: Negative for fracture or focal lesion. Sinuses/Orbits: Mucous retention cysts in the maxillary sinuses. Mucosal thickening in the frontal sinuses and ethmoid air cells. No acute finding in the orbits. Other: The mastoid air cells are well aerated.  IMPRESSION: No acute intracranial process. Electronically Signed   By: Donald  Vasan M.D.   On: 11/23/2023 12:18   DG Chest 2 View Result Date: 11/23/2023 CLINICAL DATA:  Altered mental status. EXAM: CHEST - 2 VIEW COMPARISON:  None Available. FINDINGS: Cardiac silhouette and mediastinal contours are within normal limits. The lungs are clear. No pleural effusion or pneumothorax. Mild dextrocurvature centered at L1-2. IMPRESSION: No active cardiopulmonary disease. Electronically Signed   By: Tanda Lyons M.D.   On: 11/23/2023 11:22      Subjective: Patient was interviewed and examined along with Dr. Merrianne, Neurology at bedside and patient's mother on patient's speaker phone.  Patient denied any complaints.  Tolerating diet.  No pain reported.  Discharge Exam:  Vitals:   11/26/23 2014 11/26/23 2351 11/27/23 0435 11/27/23 0755  BP: (!) 100/43 115/63 120/60 98/71  Pulse: 89 96 87 94  Resp: 18 18 18    Temp: 99 F (37.2 C) 98.8 F (37.1 C) 98.6 F (37 C) 98.1 F (36.7 C)  TempSrc: Oral Oral Oral Oral  SpO2: 94% 98% 99% 95%  Height:        General exam: Young female, moderately built and obese, lying comfortably propped up in bed without distress.  Oral mucosa moist.  Clinically much improved compared to a couple days earlier.  Appears to be in great spirits.  Wants to go home. Respiratory system: Clear to auscultation.  No increased work of breathing Cardiovascular system: S1 & S2 heard, RRR. No JVD, murmurs, rubs, gallops or clicks. No pedal edema.  Off of telemetry. Gastrointestinal system: Abdomen is nondistended, soft and nontender. No organomegaly or masses felt. Normal bowel sounds heard. Central nervous system: Alert and oriented.  Extensive Neurological evaluation by Dr. Lindzen again today without any deficits. Extremities: Symmetric 5 x 5 power. Skin: No rashes, lesions or ulcers Psychiatry: Judgement and insight normal. Mood & affect pleasant and appropriate.    The  results of significant diagnostics from this hospitalization (including imaging, microbiology, ancillary and laboratory) are listed below for reference.     Microbiology: Recent Results (from the past 240 hours)  Resp panel by RT-PCR (RSV, Flu A&B, Covid) Anterior Nasal Swab     Status: None   Collection Time: 11/23/23 10:07 AM   Specimen: Anterior Nasal Swab  Result Value Ref Range Status   SARS Coronavirus 2 by RT PCR NEGATIVE NEGATIVE Final   Influenza A by PCR NEGATIVE NEGATIVE Final   Influenza B by PCR NEGATIVE NEGATIVE Final    Comment: (NOTE) The Xpert Xpress SARS-CoV-2/FLU/RSV plus assay is intended as an aid in the diagnosis of influenza from Nasopharyngeal swab specimens and should not be used as a sole basis for treatment. Nasal washings and aspirates are unacceptable for Xpert Xpress SARS-CoV-2/FLU/RSV testing.  Fact Sheet for Patients: bloggercourse.com  Fact Sheet for Healthcare Providers: seriousbroker.it  This test is not yet approved or cleared by the [place]  FDA and has been authorized for detection and/or diagnosis of SARS-CoV-2 by FDA under an Emergency Use Authorization (EUA). This EUA will remain in effect (meaning this test can be used) for the duration of the COVID-19 declaration under Section 564(b)(1) of the Act, 21 U.S.C. section 360bbb-3(b)(1), unless the authorization is terminated or revoked.     Resp Syncytial Virus by PCR NEGATIVE NEGATIVE Final    Comment: (NOTE) Fact Sheet for Patients: bloggercourse.com  Fact Sheet for Healthcare Providers: seriousbroker.it  This test is not yet approved or cleared by the [place]  FDA and has been authorized for detection and/or diagnosis of SARS-CoV-2 by FDA  under an Emergency Use Authorization (EUA). This EUA will remain in effect (meaning this test can be used) for the duration of  the COVID-19 declaration under Section 564(b)(1) of the Act, 21 U.S.C. section 360bbb-3(b)(1), unless the authorization is terminated or revoked.  Performed at [facility], [address], [place], [place] [address]   Respiratory (~20 pathogens) panel by PCR     Status: None   Collection Time: 11/26/23  5:35 AM   Specimen: Nasopharyngeal Swab; Respiratory  Result Value Ref Range Status   Adenovirus NOT DETECTED NOT DETECTED Final   Coronavirus 229E NOT DETECTED NOT DETECTED Final    Comment: (NOTE) The Coronavirus on the Respiratory Panel, DOES NOT test for the novel  Coronavirus (2019 nCoV)    Coronavirus HKU1 NOT DETECTED NOT DETECTED Final   Coronavirus NL63 NOT DETECTED NOT DETECTED Final   Coronavirus OC43 NOT DETECTED NOT DETECTED Final   Metapneumovirus NOT DETECTED NOT DETECTED Final   Rhinovirus / Enterovirus NOT DETECTED NOT DETECTED Final   Influenza A NOT DETECTED NOT DETECTED Final   Influenza B NOT DETECTED NOT DETECTED Final   Parainfluenza Virus 1 NOT DETECTED NOT DETECTED Final   Parainfluenza Virus 2 NOT DETECTED NOT DETECTED Final   Parainfluenza Virus 3 NOT DETECTED NOT DETECTED Final   Parainfluenza Virus 4 NOT DETECTED NOT DETECTED Final   Respiratory Syncytial Virus NOT DETECTED NOT DETECTED Final   Bordetella pertussis NOT DETECTED NOT DETECTED Final   Bordetella Parapertussis NOT DETECTED NOT DETECTED Final   Chlamydophila pneumoniae NOT DETECTED NOT DETECTED Final   Mycoplasma pneumoniae NOT DETECTED NOT DETECTED Final    Comment: Performed at [facility], [address], [place], [place] [address]  CSF culture w Gram Stain     Status: None (Preliminary result)   Collection Time: 11/26/23 12:00 PM   Specimen: CSF; Cerebrospinal Fluid  Result Value Ref Range Status   Specimen Description CSF  Final   Special Requests NONE  Final   Gram Stain   Final    WBC PRESENT, PREDOMINANTLY MONONUCLEAR NO ORGANISMS SEEN CYTOSPIN SMEAR Performed  at [facility], [address], [place], [place] [address]    Culture PENDING  Incomplete   Report Status PENDING  Incomplete     Labs: CBC: Recent Labs  Lab 11/23/23 1006 11/23/23 1047 11/24/23 1443  WBC 7.3  --  5.5  HGB 12.5 13.3 12.4  HCT 40.6 39.0 39.5  MCV 82.9  --  80.9  PLT 277  --  274    Basic Metabolic Panel: Recent Labs  Lab 11/23/23 1006 11/23/23 1047 11/24/23 1443 11/25/23 0630 11/25/23 2210 11/26/23 0639  NA 138 141 139 138  --  137  K 3.6 3.7 3.3* 3.4* 3.7 3.5  CL 105  --  106 109  --  108  CO2 23  --  23 22  --  22  GLUCOSE 117*  --  87 85  --  107*  BUN 8  --  9 8  --  <5*  CREATININE 0.70  --  0.79 0.82  --  0.66  CALCIUM 8.9  --  8.7* 8.4*  --  8.6*  MG  --   --   --  2.0  --   --     Liver Function Tests: Recent Labs  Lab 11/23/23 1006 11/24/23 1443  AST 37 25  ALT 52* 36  ALKPHOS 57 57  BILITOT 0.4 1.1  PROT 7.3  7.3  ALBUMIN 3.6 3.5    CBG: Recent Labs  Lab 11/26/23 0612 11/26/23 1144 11/26/23 1905 11/26/23 2349 11/27/23 0620  GLUCAP 97 108* 111* 131* 144*    Hgb A1c Recent Labs    11/26/23 0639  HGBA1C 5.5     Dr. Merrianne and I discussed in detail with patient's mother via patient's speaker phone.  Mother was on the call throughout the course of our evaluation.  Patient was seen jointly by Dr. Merrianne, Neurology and this MD  Time coordinating discharge: 35 minutes  SIGNED:  Trenda Mar, MD,  FACP, [facility], [facility], [facility]   [facility] [place]     To contact the attending provider between 7A-7P or the covering provider during after hours 7P-7A, please log into the web site [email or web address] and access using universal [place] password for that web site. If you do not have the password, please call the hospital operator.

## 2023-11-27 NOTE — Plan of Care (Signed)
 A/O x 4, RA. AVS printed, education provided. Family at the bedside. All questions are answered. Ambulated with 1 assist. VS stable. Ready to DC home

## 2023-11-27 NOTE — Discharge Instructions (Signed)

## 2023-11-27 NOTE — Progress Notes (Addendum)
 NEUROLOGY CONSULT FOLLOW UP NOTE   Date of service: November 27, 2023 Patient Name: Deborah Cooper MRN:  [id number] DOB:  1991/01/18  Interval Hx/subjective  Now feels back to her baseline.   Vitals   Vitals:   11/26/23 2014 11/26/23 2351 11/27/23 0435 11/27/23 0755  BP: (!) 100/43 115/63 120/60 98/71  Pulse: 89 96 87 94  Resp: 18 18 18    Temp: 99 F (37.2 C) 98.8 F (37.1 C) 98.6 F (37 C) 98.1 F (36.7 C)  TempSrc: Oral Oral Oral Oral  SpO2: 94% 98% 99% 95%  Height:         There is no height or weight on file to calculate BMI.  Physical Exam   Physical Exam  HEENT:  [place]/AT. Neck is supple.  Lungs: Respirations unlabored Extremities: Warm and well perfused.   Neurological Examination Mental Status: Awake and alert. Oriented x 5. Speech fluent with intact naming and comprehension.  Cranial Nerves: II: Visual fields intact bilaterally. No extinction to DSS.   III,IV, VI: No ptosis. EOMI. No nystagmus.  V: Temp sensation equal bilaterally VII: Smile symmetric VIII: Hearing intact to conversation IX,X: No hoarseness or hypophonia XI: Symmetric XII: Midline tongue extension Motor: Right : Upper extremity   5/5    Left:     Upper extremity   5/5  Lower extremity   5/5     Lower extremity   5/5 No pronator drift Sensory: Light touch intact throughout, bilaterally Deep Tendon Reflexes: 2+ and symmetric bilateral brachioradialis and biceps. Trace bilateral patellar reflexes Cerebellar: No ataxia with FNF bilaterally Gait: Deferred  Medications  Current Facility-Administered Medications:    acetaminophen  (TYLENOL ) tablet 650 mg, 650 mg, Oral, Q6H PRN **OR** acetaminophen  (TYLENOL ) suppository 650 mg, 650 mg, Rectal, Q6H PRN, Melvin, Alexander B, MD   dextrose  5 % solution, , Intravenous, Continuous, Hongalgi, Trenda BIRCH, MD, Last Rate: 100 mL/hr at 11/26/23 2203, New Bag at 11/26/23 2203   dextrose  50 % solution 50 mL, 1 ampule, Intravenous, PRN, Sundil, Subrina, MD, 50  mL at 11/25/23 1401   lidocaine  (PF) (XYLOCAINE ) 1 % injection 10 mL, 10 mL, Other, Once, Merrianne Locus, MD   LORazepam  (ATIVAN ) injection 2 mg, 2 mg, Intravenous, Q6H PRN, Sundil, Subrina, MD, 2 mg at 11/24/23 0133   polyethylene glycol (MIRALAX  / GLYCOLAX ) packet 17 g, 17 g, Oral, Daily PRN, Melvin, Alexander B, MD   sodium chloride  flush (NS) 0.9 % injection 3 mL, 3 mL, Intravenous, Q12H, Seena Marsa NOVAK, MD, 3 mL at 11/26/23 2205  Labs and Diagnostic Imaging   CBC:  Recent Labs  Lab 11/23/23 1006 11/23/23 1047 11/24/23 1443  WBC 7.3  --  5.5  HGB 12.5 13.3 12.4  HCT 40.6 39.0 39.5  MCV 82.9  --  80.9  PLT 277  --  274    Basic Metabolic Panel:  Lab Results  Component Value Date   NA 137 11/26/2023   K 3.5 11/26/2023   CO2 22 11/26/2023   GLUCOSE 107 (H) 11/26/2023   BUN <5 (L) 11/26/2023   CREATININE 0.66 11/26/2023   CALCIUM 8.6 (L) 11/26/2023   GFRNONAA >60 11/26/2023   Lipid Panel: No results found for: LDLCALC HgbA1c:  Lab Results  Component Value Date   HGBA1C 5.5 11/26/2023   Urine Drug Screen:     Component Value Date/Time   LABOPIA NONE DETECTED 11/23/2023 1441   COCAINSCRNUR NONE DETECTED 11/23/2023 1441   LABBENZ NONE DETECTED 11/23/2023 1441   AMPHETMU NONE  DETECTED 11/23/2023 1441   THCU NONE DETECTED 11/23/2023 1441   LABBARB NONE DETECTED 11/23/2023 1441    Alcohol Level     Component Value Date/Time   [facility] <10 11/23/2023 1006     Assessment  Deborah Cooper is a 33 y.o. female with no past medical history who presents with disorientation and impaired attention in the setting of recent upper respiratory infection symptoms. 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   - On exam today, her mentation is within normal limits, no longer drowsy, with intact speech, attention and concentration. The patient states that she feels back to her baseline.  -  Labs: ALT elevated, but AST and T. Bili were normal. Ca and Na are normal. Glucose was mildly elevated at 117 on admission, normal today at 87. BUN and Cr normal. WBC continues to be normal. UDS negative. ESR elevated at 34. CRP normal at 0.6. UDS negative on 2/4. Influenza, RSV and Covid negative. HIV negative. Pregnancy test negative. Lupus panel negative. RPR negative. TSH normal. B12 normal.  - CT head was normal.   - MRI brain w/wo contrast: Intermittently motion degraded examination. Within this limitation, there is an unremarkable MRI appearance of the brain. No evidence of an acute intracranial abnormality. Paranasal sinus disease. - MRI cervical spine: Significantly motion degraded examination. Within this limitation, no lesion is identified within the cervical spinal cord. Mild cervical spondylosis as described. Most notably at C5-C6, a central disc protrusion mildly effaces the ventral thecal sac.  - LTM EEG report for Wednesday AM: Intermittent slow, generalized. This study is suggestive of mild diffuse encephalopathy. No seizures or epileptiform discharges were seen throughout the recording. LTM EEG was discontinued on Wednesday. - LP results are most consistent with an aseptic meningitis. CSF meningitis/encephalitis PCR panel is negative.   - Serum anti-NMDA receptor antibody level: Negative - DDx: - Overall presentation is most consistent with an aseptic meningitis - EEG was not consistent with seizure as the etiology for her presentation  - Significantly lower on the DDx is a paraneoplastic syndrome, which would not be expected to improve spontaneously without immunomodulatory treatment. Her rapid spontaneous improvement is not consistent with an autoimmune etiology.    Recommendations  - Can discharge home today - Autoimmune labs which include send outs are to be followed up on by her PCP. Expect these to be low-yield. Pending labs include antithyroglobulin antibody level and serum  paraneoplastic panel  - Results of her lab and imaging tests discussed with the patient and her mother (mother via speakerphone) at the bedside, with Dr. Judeth also present. All questions answered.  ______________________________________________________________________   Bonney SHARK, Nusaiba Guallpa, MD [facility]

## 2023-11-27 NOTE — Plan of Care (Signed)

## 2023-11-28 LAB — THYROGLOBULIN ANTIBODY: Thyroglobulin Antibody: <1 [IU]/mL (ref 0.0–0.9)

## 2023-11-29 LAB — PATHOLOGIST SMEAR REVIEW

## 2023-11-29 LAB — VDRL, CSF: VDRL Quant, CSF: NONREACTIVE

## 2023-11-29 LAB — CSF CULTURE W GRAM STAIN

## 2023-11-29 LAB — N-METHYL-D-ASPARTATE RECPT.IGG: N-methyl-D-Aspartate Recpt.IgG: NEGATIVE

## 2023-11-29 LAB — MISC LABCORP TEST (SEND OUT): Labcorp test code: 505375

## 2023-11-30 LAB — IGG CSF INDEX
Albumin CSF-mCnc: 27 mg/dL (ref 7–29)
Albumin: 3.8 g/dL — ABNORMAL LOW (ref 3.9–4.9)
CSF IgG Index: 0.5 (ref 0.0–0.7)
IgG (Immunoglobin G), Serum: 1273 mg/dL (ref 586–1602)
IgG, CSF: 4.3 mg/dL (ref 0.0–6.7)
IgG/Alb Ratio, CSF: 0.16 (ref 0.00–0.25)

## 2023-11-30 LAB — PARANEOPLASTIC AB
AGNA-1: NEGATIVE
Amphiphysin Antibody: NEGATIVE
Anti-Hu Ab: NEGATIVE
Anti-Ri Ab: NEGATIVE
Anti-Yo Ab: NEGATIVE
Antineruonal nuclear Ab Type 3: NEGATIVE
CASPR2 Antibody,Cell-based IFA: NEGATIVE
CRMP-5 IgG: NEGATIVE
Interpretation: NEGATIVE
LGI1 Antibody, Cell-based IFA: NEGATIVE
Purkinje Cell Cyto Ab Type 2: NEGATIVE
Purkinje Cell Cyto Ab Type Tr: NEGATIVE
VGCC Antibody: <1 pmol/L (ref 0.0–30.0)

## 2023-11-30 LAB — CYTOLOGY - NON PAP

## 2023-12-01 LAB — ANAEROBIC CULTURE W GRAM STAIN: Culture: NO GROWTH

## 2023-12-05 LAB — THYROGLOBULIN LEVEL: Thyroglobulin: 25 ng/mL

## 2023-12-06 LAB — OLIGOCLONAL BANDS, CSF + SERM

## 2023-12-17 LAB — CULTURE, FUNGUS WITHOUT SMEAR

## 2024-01-10 LAB — MTB-RIF NAA W AFB CULT, NON-SPUTUM
Acid Fast Culture: NEGATIVE
M tuberculosis complex: NOT DETECTED

## 2024-05-26 ENCOUNTER — Encounter: Payer: Self-pay | Admitting: Diagnostic Neuroimaging

## 2024-05-26 ENCOUNTER — Ambulatory Visit: Admitting: Diagnostic Neuroimaging

## 2024-05-26 VITALS — BP 118/80 | HR 60 | Ht 62.0 in | Wt 227.8 lb

## 2024-05-26 DIAGNOSIS — G44209 Tension-type headache, unspecified, not intractable: Secondary | ICD-10-CM

## 2024-05-26 DIAGNOSIS — G43809 Other migraine, not intractable, without status migrainosus: Secondary | ICD-10-CM | POA: Diagnosis not present

## 2024-05-26 DIAGNOSIS — G030 Nonpyogenic meningitis: Secondary | ICD-10-CM

## 2024-05-26 NOTE — Patient Instructions (Signed)
 aseptic meningitis / encephalopathy (Feb 2025, now resolved) - doing well; monitor for now  INTERMITTENT HEAD PRESSURE (mild photophobia; possible migraine variant) - doing well on nurtec every other day - may consider ibuprofen, tylenol , triptan as needed

## 2024-05-26 NOTE — Progress Notes (Signed)
 [facility]  PATIENT: Deborah Cooper DOB: [birth year]  REFERRING CLINICIAN: Benjamine Aland, MD HISTORY FROM: patient  REASON FOR VISIT: new consult   HISTORICAL  CHIEF COMPLAINT:  Chief Complaint  Patient presents with   New Patient (Initial Visit)    RM 7, Pt alone. Referred from hospital d/t viral meningitis in Feb. Pt states she has felt good. In May migraines started but her PCP has been working with her on that.    HISTORY OF PRESENT ILLNESS:   33 year old female here for evaluation of viral meningitis.  February 2025 patient presented to the hospital due to cough, malaise, body aches, confusion.  She was admitted for acute encephalopathy.  Extensive lab testing, lumbar puncture, MRI and EEG testing were obtained.  She was diagnosed with probable aseptic meningitis.  Eventually she gradually improved and returned to baseline.  Since that time no recurrent symptoms.  She does have some occasional head tightness, headache sensations which are being treated as migraine per PCP with Nurtec every other day and propranolol as needed for headache and anxiety symptoms.  She also uses some over-the-counter medications for migraine rescue.   REVIEW OF SYSTEMS: Full 14 system review of systems performed and negative with exception of: As per HPI.  ALLERGIES: Allergies  Allergen Reactions   Penicillins     HOME MEDICATIONS: Outpatient Medications Prior to Visit  Medication Sig Dispense Refill   FEROSUL 325 (65 Fe) MG tablet Take 325 mg by mouth every morning.     NURTEC 75 MG TBDP Take 75 mg by mouth every other day.     propranolol (INDERAL) 10 MG tablet Take 10 mg by mouth daily.     No facility-administered medications prior to visit.    PAST MEDICAL HISTORY: History reviewed. No pertinent past medical history.  PAST SURGICAL HISTORY: History reviewed. No pertinent surgical history.  FAMILY HISTORY: History reviewed. No pertinent family  history.  SOCIAL HISTORY: Social History   Socioeconomic History   Marital status: Single    Spouse name: Not on file   Number of children: Not on file   Years of education: Not on file   Highest education level: Not on file  Occupational History   Not on file  Tobacco Use   Smoking status: Never   Smokeless tobacco: Never  Vaping Use   Vaping status: Never Used  Substance and Sexual Activity   Alcohol use: Not Currently    Alcohol/week: 1.0 standard drink of alcohol    Types: 1 Standard drinks or equivalent per week    Comment: only on rare occasion   Drug use: Never   Sexual activity: Never  Other Topics Concern   Not on file  Social History Narrative   Not on file   Social Drivers of Health   Financial Resource Strain: Not on file  Food Insecurity: No Food Insecurity (11/24/2023)   Hunger Vital Sign    Worried About Running Out of Food in the Last Year: Never true    Ran Out of Food in the Last Year: Never true  Transportation Needs: No Transportation Needs (11/24/2023)   PRAPARE - Administrator, Civil Service (Medical): No    Lack of Transportation (Non-Medical): No  Physical Activity: Not on file  Stress: Not on file  Social Connections: Not on file  Intimate Partner Violence: Patient Unable To Answer (11/24/2023)   Humiliation, Afraid, Rape, and Kick questionnaire    Fear of Current or Ex-Partner: Patient unable  to answer    Emotionally Abused: Patient unable to answer    Physically Abused: Patient unable to answer    Sexually Abused: Patient unable to answer     PHYSICAL EXAM  GENERAL EXAM/CONSTITUTIONAL: Vitals:  Vitals:   05/26/24 0830  BP: 118/80  Pulse: 60  Weight: 227 lb 12.8 oz (103.3 kg)  Height: 5' 2 (1.575 m)   Body mass index is 41.67 kg/m. Wt Readings from Last 3 Encounters:  05/26/24 227 lb 12.8 oz (103.3 kg)   Patient is in no distress; well developed, nourished and groomed; neck is supple  CARDIOVASCULAR: Examination  of carotid arteries is normal; no carotid bruits Regular rate and rhythm, no murmurs Examination of peripheral vascular system by observation and palpation is normal  EYES: Ophthalmoscopic exam of optic discs and posterior segments is normal; no papilledema or hemorrhages No results found.  MUSCULOSKELETAL: Gait, strength, tone, movements noted in Neurologic exam below  NEUROLOGIC: MENTAL STATUS:      No data to display         awake, alert, oriented to person, place and time recent and remote memory intact normal attention and concentration language fluent, comprehension intact, naming intact fund of knowledge appropriate  CRANIAL NERVE:  2nd - no papilledema on fundoscopic exam 2nd, 3rd, 4th, 6th - pupils equal and reactive to light, visual fields full to confrontation, extraocular muscles intact, no nystagmus 5th - facial sensation symmetric 7th - facial strength symmetric 8th - hearing intact 9th - palate elevates symmetrically, uvula midline 11th - shoulder shrug symmetric 12th - tongue protrusion midline  MOTOR:  normal bulk and tone, full strength in the BUE, BLE  SENSORY:  normal and symmetric to light touch, temperature, vibration  COORDINATION:  finger-nose-finger, fine finger movements normal  REFLEXES:  deep tendon reflexes 1+ and symmetric  GAIT/STATION:  narrow based gait     DIAGNOSTIC DATA (LABS, IMAGING, TESTING) - I reviewed patient records, labs, notes, testing and imaging myself where available.  Lab Results  Component Value Date   WBC 5.5 11/24/2023   HGB 12.4 11/24/2023   HCT 39.5 11/24/2023   MCV 80.9 11/24/2023   PLT 274 11/24/2023      Component Value Date/Time   NA 137 11/26/2023 0639   K 3.5 11/26/2023 0639   CL 108 11/26/2023 0639   CO2 22 11/26/2023 0639   GLUCOSE 107 (H) 11/26/2023 0639   BUN <5 (L) 11/26/2023 0639   CREATININE 0.66 11/26/2023 0639   CALCIUM 8.6 (L) 11/26/2023 0639   PROT 7.3 11/24/2023 1443    ALBUMIN 3.8 (L) 11/26/2023 1200   AST 25 11/24/2023 1443   ALT 36 11/24/2023 1443   ALKPHOS 57 11/24/2023 1443   BILITOT 1.1 11/24/2023 1443   GFRNONAA >60 11/26/2023 0639   No results found for: CHOL, HDL, LDLCALC, LDLDIRECT, TRIG, CHOLHDL Lab Results  Component Value Date   HGBA1C 5.5 11/26/2023   Lab Results  Component Value Date   VITAMINB12 581 11/23/2023   Lab Results  Component Value Date   TSH 1.767 11/23/2023    Feb 2025 HOSPITAL WORKUP: CT head 2/4 without acute findings. MRI head without and with contrast: Intermittently motion degraded.  Unremarkable MRI appearance of the brain.  No acute intracranial abnormality. MRI C-spine: Significantly motion degraded.  No lesion identified within the cervical spinal cord. LTM VIDEO EEG: Suggestive of mild diffuse encephalopathy without seizures or epileptiform discharges Extensive workup as follows unremarkable: CRP, vitamin B12, ammonia level, VBG without CO2 retention,  no leukocytosis, negative serum acetaminophen  and salicylate level.  Negative blood alcohol level.  UDS negative.  Urine pregnancy and HIV screen negative.  RPR nonreactive.  TSH 1.767.  No leukocytosis.  Respiratory panel by PCR: Negative.  NMDA receptor IgG: Negative.  Cryptococcal antigen negative.  Meningitis/encephalitis panel: Negative Outstanding labs that need to be followed as outpatient: NMDA receptor IgG (?  CSF), paraneoplastic antibodies, thyroglobulin antibody, thyroglobulin --> all negative  Component Ref Range & Units (hover) 6 mo ago (11/26/23) 6 mo ago (11/24/23) 6 mo ago (11/23/23)  N-methyl-D-Aspartate Recpt.IgG Negative Negative CM Negative CM   Component Ref Range & Units (hover) 6 mo ago  Interpretation Negative   Component Ref Range & Units (hover) 6 mo ago  Thyroglobulin Antibody <1.0   Underwent LP under fluoroscopy by IR on 2/7. CSF: Clear, glucose 62, RBC count 34 and 21 (tube 1 and 4), WBC 19 and 29 (tube 1 and 4), 0  neutrophils, 99 and 98 lymphocytes, 1 and 2 monocytes and total protein 42.  No organisms seen on CSF Gram stain. Pending CSF results that must be followed as outpatient: Bacterial and fungal culture, AFB culture, pathology smear, IgG-CSF index, oligoclonal bands, VDRL --> all negative  CSF cultures --> negative  NO FUNGUS ISOLATED AFTER 21 DAYS   NO ANAEROBES ISOLATED  No malignant cells identified    NO GROWTH 3 DAYS   Component Ref Range & Units (hover) 6 mo ago  CSF Oligoclonal Bands Comment  Comment: (NOTE) Zero (0) oligoclonal bands were observed in the CSF.        Component Ref Range & Units (hover) 6 mo ago (11/26/23) 6 mo ago (11/24/23) 6 mo ago (11/23/23)  IgG, CSF 4.3    Albumin CSF-mCnc 27    IgG (Immunoglobin G), Serum 1,273    Albumin 3.8 Low  3.5 R 3.6 R  IgG/Alb Ratio, CSF 0.16    CSF IgG Index 0.5         Component Ref Range & Units (hover) 6 mo ago  VDRL Quant, CSF Non Reactive     CSF Gram stain negative, cultures negative    ASSESSMENT AND PLAN  33 y.o. year old female here with:   Dx:  1. Aseptic meningitis   2. Tension headache   3. Migraine variant      PLAN:  aseptic meningitis / encephalopathy (Feb 2025, now resolved) - doing well; monitor for now  INTERMITTENT HEAD PRESSURE (mild photophobia; possible migraine variant) - doing well on nurtec every other day - may consider ibuprofen, tylenol , triptan as needed  Return for return to PCP, pending if symptoms worsen or fail to improve.    EDUARD FABIENE HANLON, MD 05/26/2024, 9:29 AM Certified in Neurology, Neurophysiology and Neuroimaging  [facility] [address] [place], [place] [address] [phone]

## 2024-08-03 ENCOUNTER — Other Ambulatory Visit (HOSPITAL_COMMUNITY): Admission: RE | Admit: 2024-08-03 | Source: Ambulatory Visit

## 2024-08-03 ENCOUNTER — Other Ambulatory Visit: Payer: Self-pay | Admitting: Family Medicine

## 2024-08-03 ENCOUNTER — Other Ambulatory Visit (HOSPITAL_COMMUNITY)
Admission: RE | Admit: 2024-08-03 | Discharge: 2024-08-03 | Disposition: A | Discharge status: Home / Self Care | Source: Ambulatory Visit | Attending: Family Medicine | Admitting: Family Medicine

## 2024-08-03 DIAGNOSIS — Z114 Encounter for screening for human immunodeficiency virus [HIV]: Secondary | ICD-10-CM | POA: Insufficient documentation

## 2024-08-07 LAB — MOLECULAR ANCILLARY ONLY
Bacterial Vaginitis (gardnerella): POSITIVE — AB
Candida Glabrata: NEGATIVE
Candida Vaginitis: NEGATIVE
Chlamydia: NEGATIVE
Comment: NEGATIVE
Comment: NEGATIVE
Comment: NEGATIVE
Comment: NEGATIVE
Comment: NEGATIVE
Comment: NORMAL
Neisseria Gonorrhea: NEGATIVE
Trichomonas: NEGATIVE

## 2024-08-09 LAB — CYTOLOGY - PAP
Adequacy: ABSENT
Comment: NEGATIVE
Diagnosis: NEGATIVE
High risk HPV: NEGATIVE
